# Patient Record
Sex: Female | Born: 1945 | Race: Black or African American | Hispanic: No | Marital: Married | Smoking: Never smoker
Health system: Southern US, Community
[De-identification: ages and names within clinical notes are randomized; demographics above are authoritative.]

## PROBLEM LIST (undated history)

## (undated) DIAGNOSIS — M1990 Unspecified osteoarthritis, unspecified site: Secondary | ICD-10-CM

## (undated) DIAGNOSIS — R12 Heartburn: Secondary | ICD-10-CM

## (undated) DIAGNOSIS — M810 Age-related osteoporosis without current pathological fracture: Secondary | ICD-10-CM

## (undated) DIAGNOSIS — K5730 Diverticulosis of large intestine without perforation or abscess without bleeding: Secondary | ICD-10-CM

## (undated) DIAGNOSIS — Z862 Personal history of diseases of the blood and blood-forming organs and certain disorders involving the immune mechanism: Secondary | ICD-10-CM

## (undated) DIAGNOSIS — I10 Essential (primary) hypertension: Secondary | ICD-10-CM

## (undated) DIAGNOSIS — E7800 Pure hypercholesterolemia, unspecified: Secondary | ICD-10-CM

## (undated) DIAGNOSIS — K219 Gastro-esophageal reflux disease without esophagitis: Secondary | ICD-10-CM

## (undated) DIAGNOSIS — K5900 Constipation, unspecified: Secondary | ICD-10-CM

## (undated) HISTORY — PX: STOMACH SURGERY: SHX791

## (undated) HISTORY — DX: Gastro-esophageal reflux disease without esophagitis: K21.9

## (undated) HISTORY — DX: Pure hypercholesterolemia, unspecified: E78.00

## (undated) HISTORY — PX: NASAL SINUS SURGERY: SHX719

## (undated) HISTORY — DX: Essential (primary) hypertension: I10

## (undated) HISTORY — PX: PARTIAL HYSTERECTOMY: SHX80

## (undated) HISTORY — DX: Heartburn: R12

## (undated) HISTORY — DX: Diverticulosis of large intestine without perforation or abscess without bleeding: K57.30

## (undated) HISTORY — DX: Age-related osteoporosis without current pathological fracture: M81.0

## (undated) HISTORY — DX: Personal history of diseases of the blood and blood-forming organs and certain disorders involving the immune mechanism: Z86.2

## (undated) HISTORY — DX: Constipation, unspecified: K59.00

## (undated) HISTORY — PX: FOOT SURGERY: SHX648

## (undated) HISTORY — DX: Unspecified osteoarthritis, unspecified site: M19.90

---

## 2001-02-09 ENCOUNTER — Ambulatory Visit (HOSPITAL_COMMUNITY): Admission: RE | Admit: 2001-02-09 | Discharge: 2001-02-09 | Payer: Self-pay | Admitting: Nephrology

## 2001-02-09 ENCOUNTER — Encounter: Payer: Self-pay | Admitting: Nephrology

## 2001-04-07 ENCOUNTER — Other Ambulatory Visit: Admission: RE | Admit: 2001-04-07 | Discharge: 2001-04-07 | Payer: Self-pay | Admitting: *Deleted

## 2002-02-27 ENCOUNTER — Encounter: Payer: Self-pay | Admitting: Nephrology

## 2002-02-27 ENCOUNTER — Ambulatory Visit (HOSPITAL_COMMUNITY): Admission: RE | Admit: 2002-02-27 | Discharge: 2002-02-27 | Payer: Self-pay | Admitting: Nephrology

## 2003-03-21 ENCOUNTER — Ambulatory Visit (HOSPITAL_COMMUNITY): Admission: RE | Admit: 2003-03-21 | Discharge: 2003-03-21 | Payer: Self-pay | Admitting: Nephrology

## 2003-03-21 ENCOUNTER — Encounter: Payer: Self-pay | Admitting: Nephrology

## 2003-11-17 ENCOUNTER — Emergency Department (HOSPITAL_COMMUNITY): Admission: EM | Admit: 2003-11-17 | Discharge: 2003-11-17 | Payer: Self-pay | Admitting: Emergency Medicine

## 2004-03-26 ENCOUNTER — Ambulatory Visit (HOSPITAL_COMMUNITY): Admission: RE | Admit: 2004-03-26 | Discharge: 2004-03-26 | Payer: Self-pay | Admitting: Nephrology

## 2004-05-26 ENCOUNTER — Emergency Department (HOSPITAL_COMMUNITY): Admission: EM | Admit: 2004-05-26 | Discharge: 2004-05-26 | Payer: Self-pay | Admitting: Emergency Medicine

## 2004-09-18 ENCOUNTER — Ambulatory Visit: Payer: Self-pay | Admitting: Orthopedic Surgery

## 2004-11-12 ENCOUNTER — Ambulatory Visit: Payer: Self-pay | Admitting: Orthopedic Surgery

## 2004-12-10 ENCOUNTER — Ambulatory Visit: Payer: Self-pay | Admitting: Orthopedic Surgery

## 2005-03-27 ENCOUNTER — Ambulatory Visit (HOSPITAL_COMMUNITY): Admission: RE | Admit: 2005-03-27 | Discharge: 2005-03-27 | Payer: Self-pay | Admitting: Nephrology

## 2005-08-27 ENCOUNTER — Ambulatory Visit: Payer: Self-pay | Admitting: Internal Medicine

## 2005-09-15 ENCOUNTER — Ambulatory Visit: Payer: Self-pay | Admitting: Internal Medicine

## 2005-09-15 ENCOUNTER — Ambulatory Visit (HOSPITAL_COMMUNITY): Admission: RE | Admit: 2005-09-15 | Discharge: 2005-09-15 | Payer: Self-pay | Admitting: Internal Medicine

## 2005-09-15 HISTORY — PX: ESOPHAGOGASTRODUODENOSCOPY: SHX1529

## 2005-09-15 HISTORY — PX: COLONOSCOPY: SHX174

## 2006-03-29 ENCOUNTER — Ambulatory Visit (HOSPITAL_COMMUNITY): Admission: RE | Admit: 2006-03-29 | Discharge: 2006-03-29 | Payer: Self-pay | Admitting: Internal Medicine

## 2006-04-07 ENCOUNTER — Ambulatory Visit: Payer: Self-pay | Admitting: Internal Medicine

## 2007-04-04 ENCOUNTER — Ambulatory Visit (HOSPITAL_COMMUNITY): Admission: RE | Admit: 2007-04-04 | Discharge: 2007-04-04 | Payer: Self-pay | Admitting: Internal Medicine

## 2007-04-28 ENCOUNTER — Ambulatory Visit: Payer: Self-pay | Admitting: Gastroenterology

## 2008-05-03 ENCOUNTER — Ambulatory Visit: Payer: Self-pay | Admitting: Gastroenterology

## 2008-08-08 ENCOUNTER — Ambulatory Visit (HOSPITAL_COMMUNITY): Admission: RE | Admit: 2008-08-08 | Discharge: 2008-08-08 | Payer: Self-pay | Admitting: Internal Medicine

## 2009-05-22 DIAGNOSIS — Z8639 Personal history of other endocrine, nutritional and metabolic disease: Secondary | ICD-10-CM

## 2009-05-22 DIAGNOSIS — Z8739 Personal history of other diseases of the musculoskeletal system and connective tissue: Secondary | ICD-10-CM

## 2009-05-22 DIAGNOSIS — E785 Hyperlipidemia, unspecified: Secondary | ICD-10-CM

## 2009-05-22 DIAGNOSIS — I10 Essential (primary) hypertension: Secondary | ICD-10-CM

## 2009-05-22 DIAGNOSIS — K219 Gastro-esophageal reflux disease without esophagitis: Secondary | ICD-10-CM | POA: Insufficient documentation

## 2009-05-22 DIAGNOSIS — K5730 Diverticulosis of large intestine without perforation or abscess without bleeding: Secondary | ICD-10-CM | POA: Insufficient documentation

## 2009-05-22 DIAGNOSIS — Z862 Personal history of diseases of the blood and blood-forming organs and certain disorders involving the immune mechanism: Secondary | ICD-10-CM

## 2009-05-23 ENCOUNTER — Ambulatory Visit: Payer: Self-pay | Admitting: Gastroenterology

## 2009-07-22 ENCOUNTER — Ambulatory Visit: Payer: Self-pay | Admitting: Orthopedic Surgery

## 2009-07-22 DIAGNOSIS — M7580 Other shoulder lesions, unspecified shoulder: Secondary | ICD-10-CM

## 2009-08-14 ENCOUNTER — Ambulatory Visit (HOSPITAL_COMMUNITY): Admission: RE | Admit: 2009-08-14 | Discharge: 2009-08-14 | Payer: Self-pay | Admitting: Internal Medicine

## 2010-01-09 ENCOUNTER — Ambulatory Visit: Payer: Self-pay | Admitting: Orthopedic Surgery

## 2010-01-13 ENCOUNTER — Encounter: Payer: Self-pay | Admitting: Orthopedic Surgery

## 2010-01-13 ENCOUNTER — Encounter (HOSPITAL_COMMUNITY): Admission: RE | Admit: 2010-01-13 | Discharge: 2010-02-12 | Payer: Self-pay | Admitting: Orthopedic Surgery

## 2010-08-18 ENCOUNTER — Ambulatory Visit (HOSPITAL_COMMUNITY): Admission: RE | Admit: 2010-08-18 | Discharge: 2010-08-18 | Payer: Self-pay | Admitting: Internal Medicine

## 2010-10-28 NOTE — Assessment & Plan Note (Signed)
Summary: BI SHOULDER PAIN GOES DOWN ARMS/[facility]/BS[facility]   Visit Type:  Follow-up Primary Provider:  Ouida Sills, M.D.  CC:  bilateral shoulder pain.  History of Present Illness: I saw Whitney Patterson in the office today for a followup visit.  Whitney Patterson is a 65 years old woman with the complaint of:  bilateral shoulder pain.  Meds: Gaviscon, Amlodipine, Lisinopril/HCTZ, Lovastatin, Aleve.  07/22/09 was seen for same problem, Impingement Syndrome received bilateral shoulder shots and was started on Relafen 500 two times a day.  Injections helped shoulders last year.  Says Whitney Patterson has CTS   Pain radiates to her arms and hands   Cant sleep on shoulders at night        Allergies: 1)  ! Dilantin 2)  ! Sulfa 3)  ! Codeine 4)  ! Penicillin  Past History:  Past Medical History: Last updated: 07/22/2009 Hypertension Hyperlipidemia Diverticulosis, sigmoid acid reflux  Review of Systems       Whitney Patterson denies any neck pain.  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  Normal strength in both supraspinatus tendons.  Both shoulders were stable.  Whitney Patterson did have bilateral positive impingement signs.      Impression & Recommendations:  Problem # 1:  IMPINGEMENT SYNDROME (ICD-726.2) Assessment Deteriorated injectable shoulders  RIGHT Verbal consent obtained/The shoulder was injected with depomedrol 40mg /cc and sensorcaine .25% . There were no  complications  LEFT Verbal consent obtained/The shoulder was injected with depomedrol 40mg /cc and sensorcaine .25% . There were no complications  Does not seem to have cervical spine disease.  Does not seem to have torn cuff.  Recommend injection and physical therapy.   Orders: Physical Therapy Referral (PT) Est. Patient Level IV (16109) Depo- Medrol 40mg  (J1030) Joint Aspirate / Injection, Large (20610)  Patient Instructions: 1)  You have received an injection of cortisone today. You may experience increased pain at the injection site. Apply ice pack to the area for 20 minutes every 2 hours and take 2 xtra strength tylenol every 8 hours. This increased pain will usually resolve in 24 hours. The injection will take effect in 3-10 days.  2)  PT 3)  f/u as needed

## 2010-10-28 NOTE — Miscellaneous (Signed)
Summary: OT clinical evaluation  OT clinical evaluation   Imported By: Jacklynn Ganong 02/21/2010 09:17:42  _____________________________________________________________________  External Attachment:    Type:   Image     Comment:   External Document

## 2010-11-27 ENCOUNTER — Other Ambulatory Visit (HOSPITAL_COMMUNITY): Payer: Self-pay | Admitting: Internal Medicine

## 2010-11-27 ENCOUNTER — Ambulatory Visit (HOSPITAL_COMMUNITY)
Admission: RE | Admit: 2010-11-27 | Discharge: 2010-11-27 | Disposition: A | Discharge status: Home / Self Care | Payer: 59 | Source: Ambulatory Visit | Attending: Internal Medicine | Admitting: Internal Medicine

## 2010-11-27 DIAGNOSIS — R0602 Shortness of breath: Secondary | ICD-10-CM | POA: Insufficient documentation

## 2010-11-27 DIAGNOSIS — R0789 Other chest pain: Secondary | ICD-10-CM | POA: Insufficient documentation

## 2011-02-10 NOTE — Assessment & Plan Note (Signed)
NAMEGREGG, HOLSTER              CHART#:  [id number]   DATE:  05/03/2008                       DOB:  Jul 04, 1946   REFERRING PHYSICIAN:  Kingsley Callander. Ouida Sills, MD   PROBLEM LIST:  1. Gastroesophageal reflux disease.  2. Sigmoid diverticulosis.  3. History of anemia.  4. Hypertension.  5. Hyperlipidemia.  6. Family history of colon cancer (father greater than age 63).   SUBJECTIVE:  The patient is a 65 year old female who presents as a  return patient visit.  She has no questions, concerns, or complaints.  She keeps sinus problems.  She is on Zegerid daily and it controls her  symptoms 100% of the time.  If she eats something that she is not  supposed to, then she does have problems with heartburn.  She only has  problem swallowing her pills.  She is taking B12 to build up the  vitamins in her body.   MEDICATIONS:  1. Lisinopril/hydrochlorothiazide.  2. Lovastatin.  3. Amlodipine.  4. Zegerid 40 mg daily.  5. Aspirin 325 mg daily.  6. Vitamin B12 500 mcg daily.   OBJECTIVE:  VITAL SIGNS:  Weight 176 pounds (unchanged since July 2008),  height 5 feet and 1 inch, temperature 99.3, blood pressure 130/88, and  pulse 64.  GENERAL:  She is in no apparent distress.  Alert and oriented x4.LUNGS:  Clear to auscultation bilaterally.CARDIOVASCULAR:  Regular  rhythm.ABDOMEN:  Bowel sounds are present, soft, nontender, and  nondistended.   ASSESSMENT:  The patient is a 65 year old female who has  gastroesophageal reflux disease, which is well controlled on Zegerid.  Thank you for allowing me to see the patient in consultation.  My  recommendations follow.   RECOMMENDATIONS:  1. She should continue her Zegerid.  2. Screening colonoscopy in 5-10 years because her first-degree      relative was over the age of      40 when he was diagnosed with colon cancer.  3. Return patient visit in 1 year.       Kassie Mends, M.D.  Electronically Signed     SM/MEDQ  D:  05/03/2008  T:   05/03/2008  Job:  [id number]   cc:   Kingsley Callander. Ouida Sills, MD

## 2011-02-10 NOTE — Assessment & Plan Note (Signed)
Whitney Patterson, Whitney Patterson              CHART#:  [id number]   DATE:  04/28/2007                       DOB:  22-Dec-1945   REFERRING PHYSICIAN:  Carylon Perches.   PROBLEM LIST:  1. Gastroesophageal reflux disease.  2. Sigmoid diverticulosis.  3. History of anemia.  4. Hypertension.  5. Hyperlipidemia.  6. Family history of colon cancer( father > 60)   SUBJECTIVE:  The patient is a 65 year old female who presents as a  return patient visit.  She is a new patient to me.  She is here for her  yearly check up for her gastroesophageal reflux disease.  She denies any  difficulty swallowing.  Her heart burn is controlled with her omeprazole  twice a day.  She is taking it before she eats.  Her father had colon  cancer at age greater then 52.  She has no personal history of polyps.  She continues to take iron daily.  She has no need for Barrett's  surveillance because her initial screening upper endoscopy in December  of 2006 showed no evidence of Barrett esophagus.   MEDICATIONS:  1. Enalapril/HCTZ 5/12.5 mg daily.  2. Omeprazole 20 mg b.i.d.  3. Lovastatin.20 mg daily.  4. Felodipine 10 mg daily  5. Advil as needed.  6. Iron daily.   OBJECTIVE:  VITAL SIGNS:  Weight 176 pounds, height 5 feet 1 inch,  temperature 99.3, blood pressure 120/72, pulse 60.  GENERAL:  She is in no apparent distress.  Alert and oriented times 4.  HEENT EXAM:  Atraumatic, normocephalic.  Pupils equal and reactive to  light.  Mouth no oral lesions.  Posterior pharynx without erythema or  exudate.  NECK:  Full range of motion.  No lymphadenopathy.  LUNGS:  Clear to auscultation bilaterally.  CARDIOVASCULAR EXAM:  Irregular rhythm.  No murmur.  Normal S1 and S2.  ABDOMEN:  Bowel sounds are present.  Soft, nontender, nondistended.  No  rebound or guarding.  EXTREMITIES:  Without cyanosis, clubbing or edema.  NEUROLOGICAL:  She has no focal neurologic deficits.   ASSESSMENT:  The patient is a 65 year old female whose  gastroesophageal  reflux disease is well controlled on omeprazole twice daily.  She has a  family history of colon cancer in a first degree relative over the age  of 55.  Thank you for allowing me to see the patient in consultation.  My recommendations follow.   RECOMMENDATIONS:  1. Patient should continue her omeprazole twice daily.  2. Screening colonoscopy in 5-10 years, because her first degree      relative was over the age of 6 when diagnosed with colon cancer.  3. Return patient visit in 1 year.       Kassie Mends, M.D.  Electronically Signed     SM/MEDQ  D:  04/28/2007  T:  04/29/2007  Job:  [id number]   cc:   Kingsley Callander. Ouida Sills, MD

## 2011-02-13 NOTE — Op Note (Signed)
NAMEKATHIE, POSA             ACCOUNT NO.:  [id number]   MEDICAL RECORD NO.:  [id number]          PATIENT TYPE:  AMB   LOCATION:  DAY                           FACILITY:  [facility]   PHYSICIAN:  Lionel December, M.D.    DATE OF BIRTH:  Jun 04, 1946   DATE OF PROCEDURE:  09/15/2005  DATE OF DISCHARGE:                                 OPERATIVE REPORT   PROCEDURE:  Esophagogastroduodenoscopy with esophageal dilation followed by  colonoscopy.   INDICATION:  Whitney Patterson is a 65 year old African-American female with chronic  GERD who is also experiencing dysphagia. She was seen in the office recently  and begun on Zegerid, and she feels that it has work better than other  medicines that she has taken in the past. She is also undergoing high-risk  screening colonoscopy. Father had colon carcinoma. Her last colonoscopy was  in '99. Procedure and risks were reviewed with the patient, and informed  consent was obtained.   MEDICINES FOR CONSCIOUS SEDATION:  Cetacaine spray for pharyngeal topical  anesthesia, Demerol 50 mg IV, Versed 5 mg IV.   FINDINGS:  Procedures performed in endoscopy suite. The patient's vital  signs and O2 saturation were monitored during the procedure and remained  stable.   PROCEDURE #1:  Esophagogastroduodenoscopy. The patient was placed in left  lateral position, and Olympus videoscope was passed via oropharynx without  any difficulty into esophagus.   Esophagus. Mucosa of the esophagus was normal throughout. GE junction was  located at 36 cm from the incisors and was unremarkable. No hernia was  noted.   Stomach. It was empty and distended very well with insufflation. Folds of  proximal stomach were normal. Examination mucosa of mucosa revealed patchy  erythema and granularity at antrum, but no erosions or ulcers were noted.  Pyloric channel was patent. Angularis, fundus and cardia were examined by  retroflexing the scope and were normal.   Endoscope was withdrawn,  and esophagus was dilated by passing 54-French  Maloney dilator to full insertion. As the dilator was withdrawn, endoscope  was passed again, and there was no mucosal disruption noted to esophagus.  Endoscope was withdrawn. The patient prepared for procedure #2.   PROCEDURE #2:  Colonoscopy. Rectal examination performed. No abnormality  noted on external or digital exam. Olympus videoscope was placed in rectum  and advanced under vision into sigmoid colon and beyond. Preparation was  excellent. Scope was passed into cecum which was identified by appendiceal  orifice and ileocecal valve. Pictures taken for the record. As the scope was  withdrawn, colonic mucosa was carefully examined. There were a few tiny  diverticula at sigmoid colon, but no polyps and/or tumor masses noted.  Rectal mucosa similarly was normal. Scope was retroflexed to examine  anorectal junction which was unremarkable. Endoscope was straightened and  withdrawn. The patient tolerated the procedures well.   FINAL DIAGNOSIS:  1.  Normal examination of esophagus. Esophagus dilated by passing 54-French      Maloney dilator given history of dysphagia but without mucosal      disruption.  2.  Nonerosive antral gastritis.  3.  Few  tiny diverticula at sigmoid colon, otherwise normal colonoscopy.   RECOMMENDATIONS:  She will continue entire anti-measures and Zegerid at 40  mg p.o. q.a.m., prescription given for 30 doses with 5 refills.   She should continue yearly Hemoccults and consider next screening exam in  five years. Regarding her GERD, I would suggest that she follow-up with [place]  in six months.      Lionel December, M.D.  Electronically Signed     NR/MEDQ  D:  09/15/2005  T:  09/16/2005  Job:  [id number]
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
## 2011-05-05 ENCOUNTER — Other Ambulatory Visit: Payer: Self-pay

## 2011-05-05 ENCOUNTER — Encounter: Payer: Self-pay | Admitting: Orthopedic Surgery

## 2011-05-05 ENCOUNTER — Ambulatory Visit (INDEPENDENT_AMBULATORY_CARE_PROVIDER_SITE_OTHER): Payer: 59 | Admitting: Orthopedic Surgery

## 2011-05-05 VITALS — Resp 16 | Ht 61.5 in | Wt 166.0 lb

## 2011-05-05 DIAGNOSIS — M549 Dorsalgia, unspecified: Secondary | ICD-10-CM

## 2011-05-05 DIAGNOSIS — M5137 Other intervertebral disc degeneration, lumbosacral region: Secondary | ICD-10-CM

## 2011-05-05 DIAGNOSIS — M5136 Other intervertebral disc degeneration, lumbar region: Secondary | ICD-10-CM

## 2011-05-05 MED ORDER — PREDNISONE (PAK) 10 MG PO TABS: 10.0000 mg | ORAL_TABLET | Freq: Every day | ORAL | Status: AC

## 2011-05-05 NOTE — Progress Notes (Signed)
New problem  Chief complaint: Pain LEFT hip HPI:(4) This is a 65 year old female, who has severe throbbing pain in her lumbar spine and LEFT side of her back which is worse with sitting, associated with some catching. She denies any injury. She has not been treated. Her pain started several months ago. She denies any radicular symptoms. She denies any urinary symptoms.  ROS:(2) Positive findings include blurred vision, watering of the chest pain, shortness of breath, heartburn.  PFSH: (1) Medical history positive for high blood pressure heartburn.  Physical Exam(12) GENERAL: normal development   CDV: pulses are normal   Skin: normal  Lymph: nodes were not palpable/normal  Psychiatric: awake, alert and oriented  Neuro: normal sensation  MSK She is tender in the lumbar spine. Her lower extremities are well aligned without contracture subluxation, atrophy or tremor skin of both legs is intact. Reflexes are normal.  Assessment: Degenerative disc disease, and back pain, lumbar spine. X-rays show degenerative disc disease, normal coronal and sagittal plane alignment    Plan: Recommend steroid Dosepak and physical therapy.  Separate x-ray report 3 views, lumbar spine. There is degenerative disc disease throughout the lumbar spine although the disc spaces have maintained their height. There is facet arthritis.  Impression degenerative disc disease. L-Spine

## 2011-05-05 NOTE — Patient Instructions (Signed)
Start new medicine  Go for physical therapy  Come back in 6 weeks

## 2011-05-12 ENCOUNTER — Ambulatory Visit (HOSPITAL_COMMUNITY)
Admission: RE | Admit: 2011-05-12 | Discharge: 2011-05-12 | Disposition: A | Discharge status: Home / Self Care | Payer: 59 | Source: Ambulatory Visit | Attending: Orthopedic Surgery | Admitting: Orthopedic Surgery

## 2011-05-12 DIAGNOSIS — IMO0001 Reserved for inherently not codable concepts without codable children: Secondary | ICD-10-CM | POA: Insufficient documentation

## 2011-05-12 DIAGNOSIS — M545 Low back pain, unspecified: Secondary | ICD-10-CM | POA: Insufficient documentation

## 2011-05-12 DIAGNOSIS — R262 Difficulty in walking, not elsewhere classified: Secondary | ICD-10-CM | POA: Insufficient documentation

## 2011-05-12 DIAGNOSIS — I10 Essential (primary) hypertension: Secondary | ICD-10-CM | POA: Insufficient documentation

## 2011-05-12 DIAGNOSIS — M6281 Muscle weakness (generalized): Secondary | ICD-10-CM | POA: Insufficient documentation

## 2011-05-12 NOTE — Progress Notes (Signed)
Patient Name: Whitney Patterson MRN: [id number] Today's Date: 05/12/2011 Time: 8657-8469 Visit: 1 of 8 Re-eval Due: 06/09/11 Charges: 1 eval, 10 min TE, 5 min man Treatment: SUPINE:  SKTC LLE 3x30 sec SEATED:  Piriformis Stretch 3x30 sec. STANDING:  LLE Hip flexor stretch 3x30 sec. 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  Pt reports that she has more pain on her left side then the right side.  She reports that her pain is the worst when she sits for awhile and then goes to stand as well as when she moves incorrectly.  C/co is pain with increased dynamic movement while standing. MEDICAL HISTORY: HTN, GERD, Hyperlipidema, Gout, animea, shoulder pain PREVIOUS THERAPY: Pt has not received therapy for this condition in the past.  Cognitive Status: Pt is alert and oriented x's 4.                             Social History: Pt is married and enjoys cleaning and cooking.   Current Functional Status: Sit: with appropriate posture or R lateral lean without an increase in pain. Walk: eases with walking, Stand: unlimited with cautious movements.  Sleep: on the right side or on stomach.  Pt reports that she has most of her pain while going into certain position causes increased pain.  Reports that she has the most difficulty with pulling trash out of the trashcans  ( large), pain with washing the commodes. Prior Functional Status: Able to move in any direction without an increase in pain.  Barriers to Education: N/A. EMPLOYMENT DATA: Currently working in  house cleaning.   SUBJECTIVE:  PATIENT GOAL:  PAIN RATING: (on a scale from 0-10):  worst:  10/10    best:  2/10    current:  8/10   Nature: LB general pain to mid lumbar spine w/o radiculopathy  Aggravating Factors: fast movements or leaning to the right  Alleviating Factors: walking OBJECTIVE: POSTURE: Sitting with lumbar flexion to her Right. OBSERVATION: Difficulty maintaining a comfortable position.  AROM:  Hip and Lumbar WNL STRENGTH:   Lower Extremity Right Left  Hip flexion 5/5 4/5  Glute Max 4/5 3+/5  Glute Med 4/5 3+/5  Hip adductors 4/5 2+/5  Quadriceps 5/5 4/5  Hamstrings 5/5 4/5  Lumbar 4/5   Abdominals 3/5    NEUROLOGIC: Pt able to heel and toe walk.  Sensation is normal to BLE.   GAIT: Mild antalgic gait with decreased stance on her LLE.  FUNCTION: See above.  Balance: WNL, impaired body mechanics with lifting objects from the floor and with bed mobility.  PALPATION: Pin point Pain and tenderness to her L SI joint without significant spasm to gluteal region. Significant spasm to B: iliopsoas.   Special Test: \+ B SLR, + Crossed SLR ,  - ASLR, - SI Compression,  -B 90/90 HS,  + B Thomas test with Rectus and ITB sign, - B Ober's, + B FABER  CLINICAL ASSESSMENT:  Pt is a  65 y.o. female referred to PT for LBP.  After examination it was found that the patient has current body structure impairments including: increased pain with radicular pain to L leg, significant iliopsoas spasms, decreased LE and core strength, decreased flexibility, lack of knowledge on body mechanics and impaired posture which are limiting her ability to participate in community and work activities and functions. Pt will benefit from skilled PT service to address the above body structure impairments in order to maximize function in order to improve quality of life.      TREATMENT DIAGNOSIS: Low Back Pain 724.2 Rehab Potential: Good PLAN: 1. Therapeutic exercise to include stretching, strengthening and  HEP. 2. Gait training 3. Balance re-education 4. Manual techniques and modalities as needed for pain reduction.  FREQUENCY / DURATION: 2 x/week x 4 weeks.  SHORT TERM GOALS: to be met in 2 weeks. Patient will be able to: 1. Pt will be Independent in HEP in order to maximize therapeutic effect. 2. Pt will report pain less than 4/10 for 50% of her day. 3. Pt will have minimal spasm to anterior hip region.  4. Pt will increase LE strength by 1 muscle grade for improved ambulation. LONG TERM GOALS: to be met in 4 weeks. Patient will be able to: 1. Pt will report pain less than or equal to 3/10 for 75% of her day for improved quality of life. 2. Pt will improve LE flexibility in order to ambulate with improved gait mechanics and proper posture.  3. Pt will present with proper body mechanics in order to take trash out of a large trashcan without an increase in back pain.   INITIAL TREATMENT: Initial evaluation, HEP and education on role of PT.  Pt agreeable to tx. And plan PRECAUTIONS: N/A. CONTRAINDICATIONS: N/A. Thank you for the referral,   ____________________________                                                                              Annett Fabian, PT              Date                      Physician Treatment Plan Checklist Your signature is required to indicate approval of the treatment plan as stated above.  Please make a copy of this report for your files and return this physician signed original in the self-addressed envelope. PLEASE CHECK ONE: _____ 1. APPROVE PLAN _____2. APPROVE PLAN WITH THE FOLLOWING CHANGES: ______ __________________________________________________________________________________ ________________________________   __________________________ PHYSICIAN SIGNATURE       DATE    Brenan Modesto 05/12/2011, 4:18 PM

## 2011-05-14 ENCOUNTER — Ambulatory Visit (HOSPITAL_COMMUNITY)
Admission: RE | Admit: 2011-05-14 | Discharge: 2011-05-14 | Disposition: A | Discharge status: Home / Self Care | Payer: 59 | Source: Ambulatory Visit | Attending: Physical Therapy | Admitting: Physical Therapy

## 2011-05-14 NOTE — Progress Notes (Signed)
Physical Therapy Treatment Patient Details  Name: Whitney Patterson MRN: [id number] Date of Birth: 10-21-1945  Today's Date: 05/14/2011 Time: 1308-6578 Time Calculation (min): 45 min Visit#: 2 of 8 Re-eval: 06/09/11 Charge: therex 30 min Manual 7 min  Subjective: Symptoms/Limitations Symptoms: 3/10 LBP/L hip Pain Assessment Currently in Pain?: Yes Pain Score:   3 Pain Location: Back Pain Orientation: Left;Lower  Objective:  Exercise/Treatments SUPINE:  SKTC LLE 3x30 sec  LTR 3x 20" Bridge 10x Abd iso 10x 5" with diaphragmatic breathing Clam 10x Manual Piriformis st (knee to opp. Shoulder) 3x 30" SIDELYING: Abduction 10x B Adduction 10x B PRONE: Hip Extension 10x B STANDING:  LLE Hip flexor stretch 3x30 sec.  Functional squats 10x with manual assistance for proper position/tech MANUAL:  SCS Bil iliopsoas w/ 100% release x 7 min TM @ 1.5 x 5'  Physical Therapy Assessment and Plan PT Assessment and Plan Clinical Impression Statement: Pt tolerated well towards all ex with decreased pain at end of session following manual SCS to iliopsoas.  VC for posture and equal stride length with TM, pt able to amb properly following cues. PT Plan: Continue with current POC, add heel/toe raises and progress core/lumbar strength.    Goals    Problem List Patient Active Problem List  Diagnoses  . HYPERLIPIDEMIA  . HYPERTENSION  . GERD  . DIVERTICULOSIS, SIGMOID COLON  . IMPINGEMENT SYNDROME  . GOUT, HX OF  . ANEMIA, HX OF  . ARTHRITIS, HX OF  . Low back pain    PT - End of Session Activity Tolerance: Patient tolerated treatment well General Behavior During Session: [facility] for tasks performed Cognition: [facility] for tasks performed  [name] 05/14/2011, 2:45 PM

## 2011-05-19 ENCOUNTER — Ambulatory Visit (HOSPITAL_COMMUNITY)
Admission: RE | Admit: 2011-05-19 | Discharge: 2011-05-19 | Disposition: A | Discharge status: Home / Self Care | Payer: 59 | Source: Ambulatory Visit | Attending: Internal Medicine | Admitting: Internal Medicine

## 2011-05-19 NOTE — Progress Notes (Signed)
Physical Therapy Treatment Patient Details  Name: Whitney Patterson MRN: [id number] Date of Birth: 15-Oct-1945  Today's Date: 05/19/2011 Time: 1304-1400 Time Calculation (min): 56 min Visit#: 3 of 8 Re-eval: 06/09/11 Charge: therex x 50 min  Subjective: Symptoms/Limitations Symptoms: Pain free today Pain Assessment Currently in Pain?: No/denies  Objective:    Exercise/Treatments Stretches Single Knee to Chest Stretch: 3 reps;30 seconds Lower Trunk Rotation: 3 reps;20 seconds Piriformis Stretch: 3 reps;30 seconds Lumbar Exercises   Stability Clam: 10 reps;Supine Bridge: 10 reps Bent Knee Raise: 10 reps Ab Set: 10 reps;5 seconds Large Ball Abdominal Isometric: Supine;5 reps;5 seconds Large Ball Oblique Isometric: Supine;5 reps;5 seconds Hip Abduction: 10 reps;Side-lying Functional Squats: 10 reps Heel Raises: 10 reps Hip Adduction B Sidelying 10 reps Machine Exercises Cybex Lumbar Extension: 10 reps 2 PL Tread Mill: 5' @ 1.7     Physical Therapy Assessment and Plan PT Assessment and Plan Clinical Impression Statement: Added abdominal strengthening ex with ball with tactile/vc required for proper technique. PT Plan: Continue with current POC, progress core/lumbar strength, add prone lumbar strengthening ex next tx.    Goals    Problem List Patient Active Problem List  Diagnoses  . HYPERLIPIDEMIA  . HYPERTENSION  . GERD  . DIVERTICULOSIS, SIGMOID COLON  . IMPINGEMENT SYNDROME  . GOUT, HX OF  . ANEMIA, HX OF  . ARTHRITIS, HX OF  . Low back pain    PT - End of Session Activity Tolerance: Patient tolerated treatment well General Behavior During Session: [facility] for tasks performed Cognition: [facility] for tasks performed  [name] 05/19/2011, 1:59 PM

## 2011-05-21 ENCOUNTER — Ambulatory Visit (HOSPITAL_COMMUNITY)
Admission: RE | Admit: 2011-05-21 | Discharge: 2011-05-21 | Disposition: A | Discharge status: Home / Self Care | Payer: 59 | Source: Ambulatory Visit

## 2011-05-21 NOTE — Progress Notes (Signed)
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 Pain Assessment Currently in Pain?: No/denies  Objective:   Exercise/Treatments Stretches Single Knee to Chest Stretch: Other (comment) (HEP) Lower Trunk Rotation: 3 reps;20 seconds;Other (comment) (HEP) Piriformis Stretch: 3 reps;30 seconds Lumbar Exercises   Stability Clam: 15 reps Bridge: 15 reps Bent Knee Raise: 10 reps Ab Set: 10 reps;5 seconds Large Ball Abdominal Isometric: 10 reps;5 seconds Large Ball Oblique Isometric: 10 reps;5 seconds Hip Abduction: Side-lying;15 reps Heel Squeeze: 5 reps;5 seconds Single Arm Raise: 5 reps;5 seconds Leg Raise: 10 reps;5 seconds Opposite Arm/Leg Raise: Right arm/Left leg;Left arm/Right leg;5 reps;5 seconds;Prone Functional Squats: 15 reps Wall Slides: 5 reps;5 seconds Machine Exercises Cybex Lumbar Extension: 10 reps 3 PL Tread Mill: 5' @ 1.9   Standing: Tread Mill: 5' @ 1.9 Functional Squats: 10 reps  Wall slides 5 x 5" Cybex Lumbar Extension: 10 reps 2 PL  Supine Lower Trunk Rotation: 3 reps;20 seconds  Piriformis Stretch: 3 reps;30 seconds  Clam: 15 reps;Supine  Bridge: 15 reps  Bent Knee Raise: 10 reps  Ab Set: 10 reps;5 seconds  Large Ball Abdominal Isometric: Supine;5 reps;5 seconds  Large Ball Oblique Isometric: Supine;5 reps;5 seconds  Prone: Heel squeeze 5x 5" SAR 5x 5" SLR 10x 5" Opposite arm/leg 5x 5" Sidelying: Abduction: 15 reps B Adduction: 15 reps B  Physical Therapy Assessment and Plan PT Assessment and Plan Clinical Impression Statement: Progressed to prone lumbar therex with verbal/tactile cues for proper  form/tech, pt performed correctly following cues.  LTR and SKTC moved to HEP, pt able to perform correctly with no cueing required. PT Plan: Continue with current POC, progress core/lumbar strength add cybex hip ex machine next tx.    Goals    Problem List Patient Active Problem List  Diagnoses  . HYPERLIPIDEMIA  . HYPERTENSION  . GERD  . DIVERTICULOSIS, SIGMOID COLON  . IMPINGEMENT SYNDROME  . GOUT, HX OF  . ANEMIA, HX OF  . ARTHRITIS, HX OF  . Low back pain    PT - End of Session Activity Tolerance: Patient tolerated treatment well General Behavior During Session: [facility] for tasks performed Cognition: [facility] for tasks performed  Whitney Patterson 05/21/2011, 2:03 PM

## 2011-05-26 ENCOUNTER — Inpatient Hospital Stay (HOSPITAL_COMMUNITY): Admission: RE | Admit: 2011-05-26 | Payer: 59 | Source: Ambulatory Visit

## 2011-05-28 ENCOUNTER — Ambulatory Visit (HOSPITAL_COMMUNITY): Payer: 59 | Admitting: Physical Therapy

## 2011-06-02 ENCOUNTER — Ambulatory Visit (HOSPITAL_COMMUNITY): Payer: 59

## 2011-06-04 ENCOUNTER — Ambulatory Visit (HOSPITAL_COMMUNITY): Payer: 59 | Admitting: Physical Therapy

## 2011-06-11 ENCOUNTER — Telehealth: Payer: Self-pay | Admitting: Orthopedic Surgery

## 2011-06-11 NOTE — Telephone Encounter (Signed)
Patient cancelled follow up appointment for now for 06/17/11; offered re-schedule, states will call back to re-schedule.

## 2011-06-17 ENCOUNTER — Ambulatory Visit: Payer: 59 | Admitting: Orthopedic Surgery

## 2011-07-13 ENCOUNTER — Other Ambulatory Visit (HOSPITAL_COMMUNITY): Payer: Self-pay | Admitting: Internal Medicine

## 2011-07-13 DIAGNOSIS — Z139 Encounter for screening, unspecified: Secondary | ICD-10-CM

## 2011-08-24 ENCOUNTER — Ambulatory Visit (HOSPITAL_COMMUNITY)
Admission: RE | Admit: 2011-08-24 | Discharge: 2011-08-24 | Disposition: A | Discharge status: Home / Self Care | Payer: 59 | Source: Ambulatory Visit | Attending: Internal Medicine | Admitting: Internal Medicine

## 2011-08-24 DIAGNOSIS — Z139 Encounter for screening, unspecified: Secondary | ICD-10-CM

## 2011-08-24 DIAGNOSIS — Z1231 Encounter for screening mammogram for malignant neoplasm of breast: Secondary | ICD-10-CM | POA: Insufficient documentation

## 2012-03-17 ENCOUNTER — Encounter: Payer: Self-pay | Admitting: Gastroenterology

## 2012-04-13 ENCOUNTER — Encounter: Payer: Self-pay | Admitting: Internal Medicine

## 2012-04-15 ENCOUNTER — Encounter: Payer: Self-pay | Admitting: Urgent Care

## 2012-04-15 ENCOUNTER — Ambulatory Visit (INDEPENDENT_AMBULATORY_CARE_PROVIDER_SITE_OTHER): Payer: 59 | Admitting: Urgent Care

## 2012-04-15 ENCOUNTER — Encounter (HOSPITAL_COMMUNITY): Payer: Self-pay | Admitting: Pharmacy Technician

## 2012-04-15 ENCOUNTER — Other Ambulatory Visit: Payer: Self-pay | Admitting: Internal Medicine

## 2012-04-15 ENCOUNTER — Other Ambulatory Visit: Payer: Self-pay | Admitting: Gastroenterology

## 2012-04-15 VITALS — BP 132/77 | HR 66 | Temp 97.2°F | Ht 61.0 in | Wt 171.6 lb

## 2012-04-15 DIAGNOSIS — K5900 Constipation, unspecified: Secondary | ICD-10-CM | POA: Insufficient documentation

## 2012-04-15 DIAGNOSIS — Z800 Family history of malignant neoplasm of digestive organs: Secondary | ICD-10-CM

## 2012-04-15 DIAGNOSIS — K5909 Other constipation: Secondary | ICD-10-CM

## 2012-04-15 MED ORDER — PEG 3350-KCL-NA BICARB-NACL 420 G PO SOLR: ORAL | Status: AC

## 2012-04-15 NOTE — Assessment & Plan Note (Signed)
Due for high-risk screening colonoscopy.  I have discussed risks & benefits which include, but are not limited to, bleeding, infection, perforation & drug reaction.  The patient agrees with this plan & written consent will be obtained.    

## 2012-04-15 NOTE — Progress Notes (Signed)
Primary Care Physician:  Avon Gully, MD Primary Gastroenterologist:  Dr. Jonette Eva  Chief Complaint  Patient presents with  . Colonoscopy    HPI:  Whitney Patterson is a 66 y.o. female here to set up high risk screening colonoscopy due to family history of colon cancer in her father.  She reports lifelong chronic constipation for which she takes Epson salts or [employer] MOM andely.  Hx  GERD well controlled on Prilosec 20mg  daily.   Denies any nausea, vomiting, dysphagia, odynophagia or anorexia.   Denies rectal bleeding, melena or weight loss.  Past Medical History  Diagnosis Date  . HTN (hypertension)   . Arthritis   . High cholesterol   . Heart burn   . Gastroesophageal reflux disease   . Sigmoid diverticulosis   . History of anemia   . Constipation     Past Surgical History  Procedure Date  . Foot surgery   . Stomach surgery   . Partial hysterectomy   . Esophagogastroduodenoscopy 09/15/2005    Normal examination of esophagus. Esophagus dilated by passing 54-French  Maloney dilator given history of dysphagia but without mucosal disruption/ Nonerosive antral gastritis  . Colonoscopy 09/15/2005    Few tiny diverticula at sigmoid colon, otherwise normal colonoscopy.  . Nasal sinus surgery      A BULLET REMOVED FROM HER RIGHT FRONTAL SINUS 20 + YEARS AGO     Current Outpatient Prescriptions  Medication Sig Dispense Refill  . amLODipine (NORVASC) 10 MG tablet Take 10 mg by mouth daily.        [name] Kitchen lisinopril-hydrochlorothiazide (PRINZIDE,ZESTORETIC) 10-12.5 MG per tablet Take 1 tablet by mouth daily.        [name] Kitchen lovastatin (MEVACOR) 20 MG tablet Take 20 mg by mouth at bedtime.        . Naproxen Sodium (ALEVE) 220 MG CAPS Take by mouth.        [name] Kitchen omeprazole (PRILOSEC) 20 MG capsule Take 20 mg by mouth daily.        . polyethylene glycol-electrolytes (TRILYTE) 420 G solution Use as directed Also buy 1 fleet enema & 4 dulcolax tablets to use as directed  4000 mL  0     Allergies as of 04/15/2012 - Review Complete 04/15/2012  Allergen Reaction Noted  . Codeine    . Penicillins    . Phenytoin    . Sulfonamide derivatives      Family History  Problem Relation Age of Onset  . Heart disease    . Arthritis    . Cancer    . Diabetes    . Colon cancer Father 17    History   Social History  . Marital Status: Married    Spouse Name: N/A    Number of Children: 0  . Years of Education: 8th grade   Occupational History  . maid   .     Social History Main Topics  . Smoking status: Never Smoker   . Smokeless tobacco: Not on file  . Alcohol Use: No     heavy etoh x73yrs, quit 1991  . Drug Use: Not on file  . Sexually Active: Not on file  Review of Systems: Gen: Denies any fever, chills, sweats, anorexia, fatigue, weakness, malaise, weight loss, and sleep disorder CV: Denies chest pain, angina, palpitations, syncope, orthopnea, PND, peripheral edema, and claudication. Resp: Denies dyspnea at rest, dyspnea with exercise, cough, sputum, wheezing, coughing up blood, and pleurisy. GI: Denies vomiting blood, jaundice, and fecal incontinence.  GU : Denies urinary burning, blood in urine, urinary frequency, urinary hesitancy, nocturnal urination, and urinary incontinence. MS: Denies joint pain, limitation of movement, and swelling, stiffness, low back pain, extremity pain. Denies muscle weakness, cramps, atrophy.  Derm: Denies rash, itching, dry skin, hives, moles, warts, or unhealing ulcers.  Psych: Denies depression, anxiety, memory loss, suicidal ideation, hallucinations, paranoia, and confusion. Heme: Denies bruising, bleeding, and enlarged lymph nodes. Neuro:  Denies any headaches, dizziness, paresthesias. Endo:  Denies any problems with DM, thyroid, adrenal function.  Physical Exam: BP 132/77  Pulse 66  Temp 97.2 F (36.2 C) (Temporal)  Ht 5\' 1"  (1.549 m)  Wt 171 lb 9.6 oz (77.837 kg)  BMI 32.42 kg/m2 General:   Alert,   Well-developed, well-nourished, pleasant and cooperative in NAD Head:  Normocephalic and atraumatic. Eyes:  Sclera clear, no icterus.   Conjunctiva pink. Ears:  Normal auditory acuity. Nose:  No deformity, discharge, or lesions. Mouth:  No deformity or lesions,oropharynx pink & moist. Neck:  Supple; no masses or thyromegaly. Lungs:  Clear throughout to auscultation.   No wheezes, crackles, or rhonchi. No acute distress. Heart:  Regular rate and rhythm; no murmurs, clicks, rubs,  or gallops. Abdomen:  Normal bowel sounds.  No bruits.  Soft, non-tender and non-distended without masses, hepatosplenomegaly or hernias noted.  No guarding or rebound tenderness.   Rectal:  Deferred. Msk:  Symmetrical without gross deformities. Normal posture. Pulses:  Normal pulses noted. Extremities:  No clubbing or edema. Neurologic:  Alert and  oriented x4;  grossly normal neurologically. Skin:  Intact without significant lesions or rashes. Lymph Nodes:  No significant cervical adenopathy. Psych:  Alert and cooperative. Normal mood and affect.

## 2012-04-15 NOTE — Assessment & Plan Note (Signed)
Lifelong Constipation High-fiber diet MiraLax 17 g daily as needed for constipation (over-the-counter)

## 2012-04-15 NOTE — Patient Instructions (Addendum)
Colonoscopy with Dr. Lambert Keto diet MiraLax 17 g daily as needed for constipation (over-the-counter)

## 2012-04-18 NOTE — Progress Notes (Signed)
Faxed to PCP, Dr Fanta 

## 2012-04-27 ENCOUNTER — Encounter (HOSPITAL_COMMUNITY): Payer: Self-pay | Admitting: *Deleted

## 2012-04-27 ENCOUNTER — Encounter (HOSPITAL_COMMUNITY)
Admission: RE | Disposition: A | Discharge status: Home Health | Payer: Self-pay | Source: Ambulatory Visit | Attending: Gastroenterology

## 2012-04-27 ENCOUNTER — Ambulatory Visit (HOSPITAL_COMMUNITY)
Admission: RE | Admit: 2012-04-27 | Discharge: 2012-04-27 | Disposition: A | Discharge status: Home Health | Payer: 59 | Source: Ambulatory Visit | Attending: Gastroenterology | Admitting: Gastroenterology

## 2012-04-27 DIAGNOSIS — K5909 Other constipation: Secondary | ICD-10-CM

## 2012-04-27 DIAGNOSIS — Z79899 Other long term (current) drug therapy: Secondary | ICD-10-CM | POA: Insufficient documentation

## 2012-04-27 DIAGNOSIS — Z1211 Encounter for screening for malignant neoplasm of colon: Secondary | ICD-10-CM

## 2012-04-27 DIAGNOSIS — Z800 Family history of malignant neoplasm of digestive organs: Secondary | ICD-10-CM

## 2012-04-27 DIAGNOSIS — I10 Essential (primary) hypertension: Secondary | ICD-10-CM | POA: Insufficient documentation

## 2012-04-27 DIAGNOSIS — K648 Other hemorrhoids: Secondary | ICD-10-CM

## 2012-04-27 DIAGNOSIS — K5730 Diverticulosis of large intestine without perforation or abscess without bleeding: Secondary | ICD-10-CM

## 2012-04-27 DIAGNOSIS — E7800 Pure hypercholesterolemia, unspecified: Secondary | ICD-10-CM | POA: Insufficient documentation

## 2012-04-27 SURGERY — COLONOSCOPY
Anesthesia: Moderate Sedation

## 2012-04-27 MED ORDER — SODIUM CHLORIDE 0.45 % IV SOLN
Freq: Once | INTRAVENOUS | Status: AC
  Administered 2012-04-27: 10:00:00 via INTRAVENOUS

## 2012-04-27 MED ORDER — MEPERIDINE HCL 100 MG/ML IJ SOLN
INTRAMUSCULAR | Status: AC
  Filled 2012-04-27: qty 2

## 2012-04-27 MED ORDER — MIDAZOLAM HCL 5 MG/5ML IJ SOLN
INTRAMUSCULAR | Status: AC
  Filled 2012-04-27: qty 10

## 2012-04-27 MED ORDER — MEPERIDINE HCL 100 MG/ML IJ SOLN
INTRAMUSCULAR | Status: DC | PRN
  Administered 2012-04-27: 50 mg via INTRAVENOUS
  Administered 2012-04-27: 25 mg via INTRAVENOUS

## 2012-04-27 MED ORDER — STERILE WATER FOR IRRIGATION IR SOLN
Status: DC | PRN
  Administered 2012-04-27: 10:00:00

## 2012-04-27 MED ORDER — MIDAZOLAM HCL 5 MG/5ML IJ SOLN
INTRAMUSCULAR | Status: DC | PRN
  Administered 2012-04-27 (×2): 2 mg via INTRAVENOUS

## 2012-04-27 NOTE — Op Note (Signed)
[facility] [address] [place], [place]  [address]  COLONOSCOPY PROCEDURE REPORT  PATIENT:  Whitney, Patterson  MR#:  [id number] BIRTHDATE:  [birth year], 65 yrs. old  GENDER:  female  ENDOSCOPIST:  Jonette Eva, MD REF. BY:  Glenice Laine, M.D. ASSISTANT:  PROCEDURE DATE:  04/27/2012 PROCEDURE:  Colonoscopy 19147  INDICATIONS:  screening-father had colon ca AGE > 60  MEDICATIONS:   Demerol 75 mg IV, Versed 4 mg IV  DESCRIPTION OF PROCEDURE:    Physical exam was performed. Informed consent was obtained from the patient after explaining the benefits, risks, and alternatives to procedure.  The patient was connected to monitor and placed in left lateral position. Continuous oxygen was provided by nasal cannula and IV medicine administered through an indwelling cannula.  After administration of sedation and rectal exam, the patient's rectum was intubated and the EC-[id number] ([id number]) colonoscope was advanced under direct visualization to the cecum.  The scope was removed slowly by carefully examining the color, texture, anatomy, and integrity mucosa on the way out.  The patient was recovered in endoscopy and discharged home in satisfactory condition. <<PROCEDUREIMAGES>>  FINDINGS:  FREQUENT Diverticula were found in the sigmoid to descending colon segments.  OTHERWISE, normal colonoscopy without polyps, masses, vascular ectasias, or inflammatory changes.  SMALL Internal Hemorrhoids were found.  PREP QUALITY:    GOOD CECAL W/D TIME:     15  minutes  COMPLICATIONS:    None  ENDOSCOPIC IMPRESSION: 1) Internal hemorrhoids 2) Moderate left colon doverticulosis  RECOMMENDATIONS: HIGH FIBER DIET TCS IN 10 YEARS BECAUSE FATHER HAD COLON CA AGE > 60  REPEAT EXAM:  No  ______________________________ Jonette Eva, MD  CC:  Glenice Laine, M.D.  n. eSIGNED:   Codey Patterson at 04/27/2012 11:01 AM  Whitney Patterson, [id number]

## 2012-04-27 NOTE — H&P (Signed)
Primary Care Physician:  Avon Gully, MD Primary Gastroenterologist:  Dr. Darrick Penna  Pre-Procedure History & Physical: HPI:  Whitney Patterson is a 66 y.o. female here for FAMILY Hx COLON CA-FATHER HAD COLON CA AGE > 60.  Past Medical History  Diagnosis Date  . HTN (hypertension)   . Arthritis   . High cholesterol   . Heart burn   . Gastroesophageal reflux disease   . Sigmoid diverticulosis   . History of anemia   . Constipation     Past Surgical History  Procedure Date  . Foot surgery   . Stomach surgery   . Partial hysterectomy   . Esophagogastroduodenoscopy 09/15/2005    Normal examination of esophagus. Esophagus dilated by passing 54-French  Maloney dilator given history of dysphagia but without mucosal disruption/ Nonerosive antral gastritis  . Colonoscopy 09/15/2005    Few tiny diverticula at sigmoid colon, otherwise normal colonoscopy.  . Nasal sinus surgery      A BULLET REMOVED FROM HER RIGHT FRONTAL SINUS 20 + YEARS AGO     Prior to Admission medications   Medication Sig Start Date End Date Taking? Authorizing Provider  amLODipine (NORVASC) 10 MG tablet Take 10 mg by mouth every morning.    Yes Historical Provider, MD  lisinopril-hydrochlorothiazide (PRINZIDE,ZESTORETIC) 10-12.5 MG per tablet Take 1 tablet by mouth every morning.    Yes Historical Provider, MD  lovastatin (MEVACOR) 20 MG tablet Take 20 mg by mouth every morning.    Yes Historical Provider, MD  omeprazole (PRILOSEC) 20 MG capsule Take 20 mg by mouth every morning.    Yes Historical Provider, MD  Naproxen Sodium (ALEVE) 220 MG CAPS Take 2 capsules by mouth at bedtime.     Historical Provider, MD    Allergies as of 04/15/2012 - Review Complete 04/15/2012  Allergen Reaction Noted  . Codeine    . Penicillins    . Phenytoin Hives   . Sulfonamide derivatives      Family History  Problem Relation Age of Onset  . Heart disease    . Arthritis    . Cancer    . Diabetes    . Colon cancer Father 54     History   Social History  . Marital Status: Married    Spouse Name: N/A    Number of Children: 0  . Years of Education: 8th grade   Occupational History  . maid   .     Social History Main Topics  . Smoking status: Never Smoker   . Smokeless tobacco: Not on file  . Alcohol Use: No     heavy etoh x63yrs, quit 1991  . Drug Use: Not on file  . Sexually Active: Not on file   Other Topics Concern  . Not on file   Social History Narrative  . No narrative on file    Review of Systems: See HPI, otherwise negative ROS   Physical Exam: BP 154/77  Pulse 62  Temp 98.5 F (36.9 C) (Oral)  Resp 18  Ht 5\' 1"  (1.549 m)  Wt 171 lb (77.565 kg)  BMI 32.31 kg/m2  SpO2 97% General:   Alert,  pleasant and cooperative in NAD Head:  Normocephalic and atraumatic. Neck:  Supple;  Lungs:  Clear throughout to auscultation.    Heart:  Regular rate and rhythm. Abdomen:  Soft, nontender and nondistended. Normal bowel sounds, without guarding, and without rebound.   Neurologic:  Alert and  oriented x4;  grossly normal neurologically.  Impression/Plan:  FAMILY Hx COLON CA-FATHER HAD AGE > 60  PLAN:  1.TCS

## 2012-05-18 NOTE — Progress Notes (Signed)
JUL 2013 TCS L COLON TICS, IH  REVIEWED.

## 2012-05-27 ENCOUNTER — Ambulatory Visit (HOSPITAL_COMMUNITY)
Admission: RE | Admit: 2012-05-27 | Discharge: 2012-05-27 | Disposition: A | Discharge status: Home / Self Care | Payer: 59 | Source: Ambulatory Visit | Attending: Internal Medicine | Admitting: Internal Medicine

## 2012-05-27 ENCOUNTER — Other Ambulatory Visit (HOSPITAL_COMMUNITY): Payer: Self-pay | Admitting: Internal Medicine

## 2012-05-27 DIAGNOSIS — R52 Pain, unspecified: Secondary | ICD-10-CM

## 2012-05-27 DIAGNOSIS — M79609 Pain in unspecified limb: Secondary | ICD-10-CM | POA: Insufficient documentation

## 2012-07-22 ENCOUNTER — Other Ambulatory Visit (HOSPITAL_COMMUNITY): Payer: Self-pay | Admitting: Internal Medicine

## 2012-07-22 DIAGNOSIS — Z139 Encounter for screening, unspecified: Secondary | ICD-10-CM

## 2012-08-08 ENCOUNTER — Other Ambulatory Visit (HOSPITAL_COMMUNITY): Payer: Self-pay | Admitting: Internal Medicine

## 2012-08-08 ENCOUNTER — Ambulatory Visit (HOSPITAL_COMMUNITY)
Admission: RE | Admit: 2012-08-08 | Discharge: 2012-08-08 | Disposition: A | Discharge status: Home / Self Care | Payer: 59 | Source: Ambulatory Visit | Attending: Internal Medicine | Admitting: Internal Medicine

## 2012-08-08 DIAGNOSIS — R52 Pain, unspecified: Secondary | ICD-10-CM

## 2012-08-08 DIAGNOSIS — M25519 Pain in unspecified shoulder: Secondary | ICD-10-CM | POA: Insufficient documentation

## 2012-08-26 ENCOUNTER — Ambulatory Visit (HOSPITAL_COMMUNITY): Payer: 59

## 2012-08-29 ENCOUNTER — Ambulatory Visit (HOSPITAL_COMMUNITY)
Admission: RE | Admit: 2012-08-29 | Discharge: 2012-08-29 | Disposition: A | Discharge status: Home / Self Care | Payer: 59 | Source: Ambulatory Visit | Attending: Internal Medicine | Admitting: Internal Medicine

## 2012-08-29 DIAGNOSIS — Z139 Encounter for screening, unspecified: Secondary | ICD-10-CM

## 2012-08-29 DIAGNOSIS — Z1231 Encounter for screening mammogram for malignant neoplasm of breast: Secondary | ICD-10-CM | POA: Insufficient documentation

## 2012-09-13 ENCOUNTER — Ambulatory Visit (INDEPENDENT_AMBULATORY_CARE_PROVIDER_SITE_OTHER): Payer: 59 | Admitting: Orthopedic Surgery

## 2012-09-13 ENCOUNTER — Encounter: Payer: Self-pay | Admitting: Orthopedic Surgery

## 2012-09-13 VITALS — BP 110/56 | Ht 61.0 in | Wt 168.4 lb

## 2012-09-13 DIAGNOSIS — M719 Bursopathy, unspecified: Secondary | ICD-10-CM

## 2012-09-13 DIAGNOSIS — M67919 Unspecified disorder of synovium and tendon, unspecified shoulder: Secondary | ICD-10-CM

## 2012-09-13 MED ORDER — HYDROCODONE-ACETAMINOPHEN 5-325 MG PO TABS: 1.0000 | ORAL_TABLET | Freq: Four times a day (QID) | ORAL | Status: DC | PRN

## 2012-09-13 NOTE — Patient Instructions (Signed)
You have received a steroid shot. 15% of patients experience increased pain at the injection site with in the next 24 hours. This is best treated with ice and tylenol extra strength 2 tabs every 8 hours. If you are still having pain please call the office.   Home exercises

## 2012-09-13 NOTE — Progress Notes (Signed)
Patient ID: Whitney Patterson, female   DOB: [birth year], 66 y.o.   MRN: [id number] Chief Complaint  Patient presents with  . Shoulder Pain    Left shoulder pain started 3 months ago no injury ref by Dr. Felecia Shelling    3 mos h/o left shoulder pain and no trauma. Pain unrelieved by tramadol. Better with Aleve. Some numbness, swelling. Tends to come and go   Patient reported. Review of systems of chest pain, ordering of the shortness of breath, heartburn joint pain muscle pain, seasonal allergies.  Physical Exam(12)  Vital signs: BP 110/56  Ht 5\' 1"  (1.549 m)  Wt 168 lb 6.4 oz (76.386 kg)  BMI 31.82 kg/m2   GENERAL: normal development   CDV: pulses are normal   Skin: normal  Lymph: nodes were not palpable/normal  Psychiatric: awake, alert and oriented  Neuro: normal sensation  MSK  Gait: normal  1 Inspection left shoulder no tenderness 2 Range of Motion passive normal active 90 flexion 3 Motor 5-/5 4 Stability normal   Other side:  5 normal ROM  6 normal strength   Imaging x-rays show Acromio - Humeral distance is decreased   Assessment: RC disease     Plan: injection and therapy but she wants to do it herself  Subacromial Shoulder Injection Procedure Note  Pre-operative Diagnosis: left RC Syndrome  Post-operative Diagnosis: same  Indications: pain   Anesthesia: ethyl chloride   Procedure Details   Verbal consent was obtained for the procedure. The shoulder was prepped withalcohol and the skin was anesthetized. A 20 gauge needle was advanced into the subacromial space through posterior approach without difficulty  The space was then injected with 3 ml 1% lidocaine and 1 ml of depomedrol. The injection site was cleansed with isopropyl alcohol and a dressing was applied.  Complications:  None; patient tolerated the procedure well.

## 2013-08-28 ENCOUNTER — Other Ambulatory Visit (HOSPITAL_COMMUNITY): Payer: Self-pay | Admitting: Internal Medicine

## 2013-08-28 DIAGNOSIS — Z139 Encounter for screening, unspecified: Secondary | ICD-10-CM

## 2013-08-31 ENCOUNTER — Ambulatory Visit (HOSPITAL_COMMUNITY)
Admission: RE | Admit: 2013-08-31 | Discharge: 2013-08-31 | Disposition: A | Discharge status: Home / Self Care | Payer: BC Managed Care – PPO | Source: Ambulatory Visit | Attending: Internal Medicine | Admitting: Internal Medicine

## 2013-08-31 DIAGNOSIS — Z1231 Encounter for screening mammogram for malignant neoplasm of breast: Secondary | ICD-10-CM | POA: Insufficient documentation

## 2013-08-31 DIAGNOSIS — Z139 Encounter for screening, unspecified: Secondary | ICD-10-CM

## 2014-09-13 ENCOUNTER — Other Ambulatory Visit (HOSPITAL_COMMUNITY): Payer: Self-pay | Admitting: Internal Medicine

## 2014-09-13 DIAGNOSIS — Z1231 Encounter for screening mammogram for malignant neoplasm of breast: Secondary | ICD-10-CM

## 2014-09-26 ENCOUNTER — Ambulatory Visit (HOSPITAL_COMMUNITY)
Admission: RE | Admit: 2014-09-26 | Discharge: 2014-09-26 | Disposition: A | Discharge status: Home / Self Care | Payer: BC Managed Care – PPO | Source: Ambulatory Visit | Attending: Internal Medicine | Admitting: Internal Medicine

## 2014-09-26 DIAGNOSIS — Z1231 Encounter for screening mammogram for malignant neoplasm of breast: Secondary | ICD-10-CM | POA: Diagnosis not present

## 2015-08-01 ENCOUNTER — Emergency Department (HOSPITAL_COMMUNITY): Payer: BLUE CROSS/BLUE SHIELD

## 2015-08-01 ENCOUNTER — Encounter (HOSPITAL_COMMUNITY): Payer: Self-pay | Admitting: Emergency Medicine

## 2015-08-01 ENCOUNTER — Inpatient Hospital Stay (HOSPITAL_COMMUNITY)
Admission: EM | Admit: 2015-08-01 | Discharge: 2015-08-03 | DRG: 563 | Disposition: A | Discharge status: Home / Self Care | Payer: BLUE CROSS/BLUE SHIELD | Attending: Internal Medicine | Admitting: Internal Medicine

## 2015-08-01 DIAGNOSIS — Z833 Family history of diabetes mellitus: Secondary | ICD-10-CM | POA: Diagnosis not present

## 2015-08-01 DIAGNOSIS — E7800 Pure hypercholesterolemia, unspecified: Secondary | ICD-10-CM | POA: Diagnosis present

## 2015-08-01 DIAGNOSIS — S060X9A Concussion with loss of consciousness of unspecified duration, initial encounter: Secondary | ICD-10-CM

## 2015-08-01 DIAGNOSIS — M79622 Pain in left upper arm: Secondary | ICD-10-CM | POA: Diagnosis not present

## 2015-08-01 DIAGNOSIS — S42309A Unspecified fracture of shaft of humerus, unspecified arm, initial encounter for closed fracture: Secondary | ICD-10-CM | POA: Diagnosis present

## 2015-08-01 DIAGNOSIS — I10 Essential (primary) hypertension: Secondary | ICD-10-CM | POA: Diagnosis present

## 2015-08-01 DIAGNOSIS — M67919 Unspecified disorder of synovium and tendon, unspecified shoulder: Secondary | ICD-10-CM

## 2015-08-01 DIAGNOSIS — Z800 Family history of malignant neoplasm of digestive organs: Secondary | ICD-10-CM

## 2015-08-01 DIAGNOSIS — S060X0A Concussion without loss of consciousness, initial encounter: Secondary | ICD-10-CM | POA: Diagnosis present

## 2015-08-01 DIAGNOSIS — S42302A Unspecified fracture of shaft of humerus, left arm, initial encounter for closed fracture: Principal | ICD-10-CM | POA: Diagnosis present

## 2015-08-01 DIAGNOSIS — M1990 Unspecified osteoarthritis, unspecified site: Secondary | ICD-10-CM | POA: Diagnosis present

## 2015-08-01 DIAGNOSIS — K219 Gastro-esophageal reflux disease without esophagitis: Secondary | ICD-10-CM | POA: Diagnosis present

## 2015-08-01 DIAGNOSIS — Y92488 Other paved roadways as the place of occurrence of the external cause: Secondary | ICD-10-CM | POA: Diagnosis not present

## 2015-08-01 DIAGNOSIS — R51 Headache: Secondary | ICD-10-CM | POA: Diagnosis present

## 2015-08-01 DIAGNOSIS — S060XAA Concussion with loss of consciousness status unknown, initial encounter: Secondary | ICD-10-CM

## 2015-08-01 LAB — I-STAT CHEM 8, ED
BUN: 10 mg/dL (ref 6–20)
CREATININE: 0.8 mg/dL (ref 0.44–1.00)
Calcium, Ion: 1.06 mmol/L — ABNORMAL LOW (ref 1.13–1.30)
Chloride: 105 mmol/L (ref 101–111)
GLUCOSE: 116 mg/dL — AB (ref 65–99)
HCT: 43 % (ref 36.0–46.0)
HEMOGLOBIN: 14.6 g/dL (ref 12.0–15.0)
POTASSIUM: 3.6 mmol/L (ref 3.5–5.1)
Sodium: 140 mmol/L (ref 135–145)
TCO2: 21 mmol/L (ref 0–100)

## 2015-08-01 LAB — COMPREHENSIVE METABOLIC PANEL
ALBUMIN: 4.5 g/dL (ref 3.5–5.0)
ALK PHOS: 73 U/L (ref 38–126)
ALT: 19 U/L (ref 14–54)
ANION GAP: 12 (ref 5–15)
AST: 28 U/L (ref 15–41)
BILIRUBIN TOTAL: 0.7 mg/dL (ref 0.3–1.2)
BUN: 11 mg/dL (ref 6–20)
CALCIUM: 9.4 mg/dL (ref 8.9–10.3)
CO2: 22 mmol/L (ref 22–32)
CREATININE: 0.78 mg/dL (ref 0.44–1.00)
Chloride: 106 mmol/L (ref 101–111)
GFR calc Af Amer: >60 mL/min (ref >60)
GFR calc non Af Amer: >60 mL/min (ref >60)
GLUCOSE: 114 mg/dL — AB (ref 65–99)
Potassium: 3.6 mmol/L (ref 3.5–5.1)
Sodium: 140 mmol/L (ref 135–145)
TOTAL PROTEIN: 7.8 g/dL (ref 6.5–8.1)

## 2015-08-01 LAB — CBC WITH DIFFERENTIAL/PLATELET
BASOS ABS: 0 10*3/uL (ref 0.0–0.1)
BASOS PCT: 0 %
EOS ABS: 0.1 10*3/uL (ref 0.0–0.7)
EOS PCT: 1 %
HEMATOCRIT: 38.3 % (ref 36.0–46.0)
Hemoglobin: 12.4 g/dL (ref 12.0–15.0)
Lymphocytes Relative: 19 %
Lymphs Abs: 2.8 10*3/uL (ref 0.7–4.0)
MCH: 28.5 pg (ref 26.0–34.0)
MCHC: 32.4 g/dL (ref 30.0–36.0)
MCV: 88 fL (ref 78.0–100.0)
MONO ABS: 0.8 10*3/uL (ref 0.1–1.0)
MONOS PCT: 5 %
NEUTROS ABS: 11 10*3/uL — AB (ref 1.7–7.7)
Neutrophils Relative %: 75 %
PLATELETS: 318 10*3/uL (ref 150–400)
RBC: 4.35 MIL/uL (ref 3.87–5.11)
RDW: 13.5 % (ref 11.5–15.5)
WBC: 14.7 10*3/uL — ABNORMAL HIGH (ref 4.0–10.5)

## 2015-08-01 LAB — I-STAT CG4 LACTIC ACID, ED: Lactic Acid, Venous: 3.44 mmol/L (ref 0.5–2.0)

## 2015-08-01 LAB — TYPE AND SCREEN
ABO/RH(D): A POS
ANTIBODY SCREEN: NEGATIVE

## 2015-08-01 MED ORDER — ONDANSETRON HCL 4 MG/2ML IJ SOLN
4.0000 mg | Freq: Once | INTRAMUSCULAR | Status: AC
  Administered 2015-08-01: 4 mg via INTRAVENOUS
  Filled 2015-08-01: qty 2

## 2015-08-01 MED ORDER — ONDANSETRON HCL 4 MG/2ML IJ SOLN: 4.0000 mg | Freq: Four times a day (QID) | INTRAMUSCULAR | Status: DC | PRN

## 2015-08-01 MED ORDER — PANTOPRAZOLE SODIUM 40 MG PO TBEC
40.0000 mg | DELAYED_RELEASE_TABLET | Freq: Every day | ORAL | Status: DC
  Administered 2015-08-01 – 2015-08-03 (×3): 40 mg via ORAL
  Filled 2015-08-01 (×3): qty 1

## 2015-08-01 MED ORDER — SODIUM CHLORIDE 0.9 % IV SOLN
INTRAVENOUS | Status: DC
Start: 2015-08-01 — End: 2015-08-03
  Administered 2015-08-01 – 2015-08-02 (×2): via INTRAVENOUS

## 2015-08-01 MED ORDER — AMLODIPINE BESYLATE 5 MG PO TABS
10.0000 mg | ORAL_TABLET | Freq: Every morning | ORAL | Status: DC
  Administered 2015-08-02 – 2015-08-03 (×3): 10 mg via ORAL
  Filled 2015-08-01 (×2): qty 2

## 2015-08-01 MED ORDER — ENOXAPARIN SODIUM 40 MG/0.4ML ~~LOC~~ SOLN
40.0000 mg | SUBCUTANEOUS | Status: DC
  Administered 2015-08-01 – 2015-08-02 (×2): 40 mg via SUBCUTANEOUS
  Filled 2015-08-01 (×2): qty 0.4

## 2015-08-01 MED ORDER — FENTANYL CITRATE (PF) 100 MCG/2ML IJ SOLN
100.0000 ug | Freq: Once | INTRAMUSCULAR | Status: AC
  Administered 2015-08-01: 100 ug via INTRAVENOUS
  Filled 2015-08-01: qty 2

## 2015-08-01 MED ORDER — ONDANSETRON HCL 4 MG PO TABS: 4.0000 mg | ORAL_TABLET | Freq: Four times a day (QID) | ORAL | Status: DC | PRN

## 2015-08-01 MED ORDER — HYDROMORPHONE HCL 1 MG/ML IJ SOLN
0.5000 mg | INTRAMUSCULAR | Status: AC | PRN
  Administered 2015-08-01 – 2015-08-02 (×2): 0.5 mg via INTRAVENOUS
  Filled 2015-08-01 (×2): qty 1

## 2015-08-01 MED ORDER — IOHEXOL 300 MG/ML  SOLN
100.0000 mL | Freq: Once | INTRAMUSCULAR | Status: AC | PRN
  Administered 2015-08-01: 100 mL via INTRAVENOUS

## 2015-08-01 MED ORDER — FENTANYL CITRATE (PF) 100 MCG/2ML IJ SOLN
50.0000 ug | Freq: Once | INTRAMUSCULAR | Status: AC
  Administered 2015-08-01: 50 ug via INTRAVENOUS
  Filled 2015-08-01: qty 2

## 2015-08-01 MED ORDER — SODIUM CHLORIDE 0.9 % IJ SOLN: 3.0000 mL | Freq: Two times a day (BID) | INTRAMUSCULAR | Status: DC

## 2015-08-01 MED ORDER — NAPROXEN 250 MG PO TABS
500.0000 mg | ORAL_TABLET | Freq: Every day | ORAL | Status: DC
  Administered 2015-08-01 – 2015-08-02 (×2): 500 mg via ORAL
  Filled 2015-08-01 (×2): qty 2

## 2015-08-01 MED ORDER — LISINOPRIL 10 MG PO TABS
20.0000 mg | ORAL_TABLET | Freq: Two times a day (BID) | ORAL | Status: DC
Start: 2015-08-01 — End: 2015-08-03
  Administered 2015-08-01 – 2015-08-03 (×4): 20 mg via ORAL
  Filled 2015-08-01 (×4): qty 2

## 2015-08-01 MED ORDER — ONDANSETRON HCL 4 MG/2ML IJ SOLN: 4.0000 mg | Freq: Three times a day (TID) | INTRAMUSCULAR | Status: DC | PRN

## 2015-08-01 MED ORDER — PRAVASTATIN SODIUM 10 MG PO TABS
20.0000 mg | ORAL_TABLET | Freq: Every day | ORAL | Status: DC
  Administered 2015-08-01 – 2015-08-02 (×2): 20 mg via ORAL
  Filled 2015-08-01 (×2): qty 2

## 2015-08-01 NOTE — ED Notes (Signed)
Patient transported to X-ray 
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
## 2015-08-01 NOTE — ED Notes (Signed)
MD gave order to discontinue PT/INR and 3 hour repeat I-Stat Lactic Acid.

## 2015-08-01 NOTE — ED Notes (Signed)
Attempted to ambulate pt per MD. Nursing staff able to assist pt to side of bed with 2 person assist. Pt c/o increased dizziness and drowsiness. Pt assisted back to lying position. MD notified.

## 2015-08-02 DIAGNOSIS — S42309A Unspecified fracture of shaft of humerus, unspecified arm, initial encounter for closed fracture: Secondary | ICD-10-CM | POA: Insufficient documentation

## 2015-08-02 LAB — CBC
HEMATOCRIT: 36.4 % (ref 36.0–46.0)
HEMOGLOBIN: 11.7 g/dL — AB (ref 12.0–15.0)
MCH: 28.3 pg (ref 26.0–34.0)
MCHC: 32.1 g/dL (ref 30.0–36.0)
MCV: 87.9 fL (ref 78.0–100.0)
Platelets: 317 10*3/uL (ref 150–400)
RBC: 4.14 MIL/uL (ref 3.87–5.11)
RDW: 14 % (ref 11.5–15.5)
WBC: 8.7 10*3/uL (ref 4.0–10.5)

## 2015-08-02 LAB — COMPREHENSIVE METABOLIC PANEL
ALBUMIN: 3.9 g/dL (ref 3.5–5.0)
ALK PHOS: 59 U/L (ref 38–126)
ALT: 17 U/L (ref 14–54)
ANION GAP: 10 (ref 5–15)
AST: 26 U/L (ref 15–41)
BUN: 11 mg/dL (ref 6–20)
CO2: 24 mmol/L (ref 22–32)
Calcium: 8.8 mg/dL — ABNORMAL LOW (ref 8.9–10.3)
Chloride: 105 mmol/L (ref 101–111)
Creatinine, Ser: 0.68 mg/dL (ref 0.44–1.00)
GFR calc Af Amer: >60 mL/min (ref >60)
GFR calc non Af Amer: >60 mL/min (ref >60)
GLUCOSE: 108 mg/dL — AB (ref 65–99)
POTASSIUM: 4.1 mmol/L (ref 3.5–5.1)
SODIUM: 139 mmol/L (ref 135–145)
Total Bilirubin: 1.4 mg/dL — ABNORMAL HIGH (ref 0.3–1.2)
Total Protein: 6.8 g/dL (ref 6.5–8.1)

## 2015-08-02 LAB — GLUCOSE, CAPILLARY: Glucose-Capillary: 118 mg/dL — ABNORMAL HIGH (ref 65–99)

## 2015-08-02 MED ORDER — HYDROCODONE-ACETAMINOPHEN 5-325 MG PO TABS
1.0000 | ORAL_TABLET | Freq: Four times a day (QID) | ORAL | Status: DC | PRN
  Administered 2015-08-02 – 2015-08-03 (×3): 1 via ORAL
  Filled 2015-08-02 (×3): qty 1

## 2015-08-02 MED ORDER — POTASSIUM CHLORIDE CRYS ER 20 MEQ PO TBCR
40.0000 meq | EXTENDED_RELEASE_TABLET | Freq: Once | ORAL | Status: AC
  Administered 2015-08-02: 40 meq via ORAL
  Filled 2015-08-02: qty 2
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
## 2015-08-02 NOTE — Care Management Note (Signed)
Case Management Note  Patient Details  Name: ROMIE KEEBLE MRN: [id number] Date of Birth: [birth year]  Subjective/Objective:                  Pt from home with husband, has very supportive family. Pt has no HH services prior to admission.   Action/Plan: Pt plans to return home with self care at DC. Pt has sling, no other DME anticipated. NoCM needs anticipated.   Expected Discharge Date:   08/03/2015               Expected Discharge Plan:  Home/Self Care  In-House Referral:  NA  Discharge planning Services  CM Consult  Post Acute Care Choice:  NA Choice offered to:  NA  DME Arranged:    DME Agency:     HH Arranged:    HH Agency:     Status of Service:  Completed, signed off  Medicare Important Message Given:    Date Medicare IM Given:    Medicare IM give by:    Date Additional Medicare IM Given:    Additional Medicare Important Message give by:     If discussed at [place] of Stay Meetings, dates discussed:    Additional Comments:  Sherald Barge, RN 08/02/2015, 4:01 PM
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
## 2015-08-03 MED ORDER — HYDROCODONE-ACETAMINOPHEN 5-325 MG PO TABS: 1.0000 | ORAL_TABLET | Freq: Four times a day (QID) | ORAL | Status: DC | PRN

## 2015-08-03 NOTE — Progress Notes (Signed)
Pt discharged via wheelchair into the care of her sister Donia Guiles via private vehicle.  Pt stable with no S&S of distress noted.  VS: WNL.  PIV removed with catheter intact and no S&S of infection noted.  Discharge instructions reviewed with pt/sister.  Pt/sister verbalized understanding.  Prescription for narcotics given to pt/sister.  *

## 2015-08-03 NOTE — Progress Notes (Signed)
She feels well and wants to go home. She still has a headache. She still has pain in her fractured arm. No other new complaints. Orthopedic consultation noted and appreciated she's going to be discharged home today
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
## 2015-08-13 ENCOUNTER — Ambulatory Visit (INDEPENDENT_AMBULATORY_CARE_PROVIDER_SITE_OTHER): Payer: BLUE CROSS/BLUE SHIELD

## 2015-08-13 ENCOUNTER — Ambulatory Visit (INDEPENDENT_AMBULATORY_CARE_PROVIDER_SITE_OTHER): Payer: Self-pay | Admitting: Orthopedic Surgery

## 2015-08-13 VITALS — Ht 61.0 in | Wt 171.0 lb

## 2015-08-13 DIAGNOSIS — S42202D Unspecified fracture of upper end of left humerus, subsequent encounter for fracture with routine healing: Secondary | ICD-10-CM

## 2015-08-13 DIAGNOSIS — S42302D Unspecified fracture of shaft of humerus, left arm, subsequent encounter for fracture with routine healing: Secondary | ICD-10-CM
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
## 2015-08-14 ENCOUNTER — Telehealth: Payer: Self-pay | Admitting: *Deleted

## 2015-08-14 NOTE — Telephone Encounter (Signed)
I made patient aware that I spoke with Whitney Patterson, and Whitney Patterson told me it was fine to let her know to hold her left hand above her heart that will help with the swelling and if her ace bandage wrap feels to tight around her left arm she can loosen it a little bit. I made patient aware that if it does not help to call us back and let us know. Patient verbally stated she understands, and she will call back if it doesn't help.

## 2015-08-14 NOTE — Telephone Encounter (Signed)
Patient called stating her left hand is swelling, patient wants to know what would Dr. Aline Brochure recommend for the swelling. Please advise

## 2015-08-14 NOTE — Telephone Encounter (Signed)
Advise to elevate hand and also to ensure that ace wrap is not to tight, if swelling continues contact our office

## 2015-08-20 ENCOUNTER — Ambulatory Visit (INDEPENDENT_AMBULATORY_CARE_PROVIDER_SITE_OTHER): Payer: Self-pay | Admitting: Orthopedic Surgery

## 2015-08-20 VITALS — BP 145/74 | Ht 61.0 in | Wt 171.0 lb

## 2015-08-20 DIAGNOSIS — S42302D Unspecified fracture of shaft of humerus, left arm, subsequent encounter for fracture with routine healing: Secondary | ICD-10-CM

## 2015-08-20 MED ORDER — TRAMADOL HCL 50 MG PO TABS: 50.0000 mg | ORAL_TABLET | Freq: Four times a day (QID) | ORAL | Status: DC | PRN
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
## 2015-09-10 ENCOUNTER — Ambulatory Visit (INDEPENDENT_AMBULATORY_CARE_PROVIDER_SITE_OTHER): Payer: BLUE CROSS/BLUE SHIELD

## 2015-09-10 ENCOUNTER — Ambulatory Visit (INDEPENDENT_AMBULATORY_CARE_PROVIDER_SITE_OTHER): Payer: Self-pay | Admitting: Orthopedic Surgery

## 2015-09-10 VITALS — BP 150/79 | Ht 61.0 in | Wt 171.0 lb

## 2015-09-10 DIAGNOSIS — S42202D Unspecified fracture of upper end of left humerus, subsequent encounter for fracture with routine healing: Secondary | ICD-10-CM

## 2015-09-10 NOTE — Progress Notes (Signed)
Fracture care follow-up  Chief Complaint  Patient presents with  . Follow-up    3 week recheck on left humerus fracture with xray. DOI 08-02-15.     This is week 5 status post midshaft humerus fracture. Patient's pain is controlled tramadol   fracture cuff fitting appropriately.  Patient had x-rays today show fracture healing with appropriate alignment   Patient's radial nerve function tests intact  Recommend x-ray in 4 weeks in the fracture cuff.

## 2015-10-10 ENCOUNTER — Ambulatory Visit (INDEPENDENT_AMBULATORY_CARE_PROVIDER_SITE_OTHER): Payer: Self-pay | Admitting: Orthopedic Surgery

## 2015-10-10 ENCOUNTER — Ambulatory Visit (HOSPITAL_COMMUNITY)
Admission: RE | Admit: 2015-10-10 | Discharge: 2015-10-10 | Disposition: A | Discharge status: Home / Self Care | Payer: BLUE CROSS/BLUE SHIELD | Source: Ambulatory Visit | Attending: Orthopedic Surgery | Admitting: Orthopedic Surgery

## 2015-10-10 ENCOUNTER — Other Ambulatory Visit: Payer: Self-pay | Admitting: Orthopedic Surgery

## 2015-10-10 DIAGNOSIS — S42302D Unspecified fracture of shaft of humerus, left arm, subsequent encounter for fracture with routine healing: Secondary | ICD-10-CM

## 2015-10-10 DIAGNOSIS — S42202A Unspecified fracture of upper end of left humerus, initial encounter for closed fracture: Secondary | ICD-10-CM

## 2015-10-10 DIAGNOSIS — M84422D Pathological fracture, left humerus, subsequent encounter for fracture with routine healing: Secondary | ICD-10-CM | POA: Diagnosis present
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
## 2015-11-11 ENCOUNTER — Ambulatory Visit (INDEPENDENT_AMBULATORY_CARE_PROVIDER_SITE_OTHER): Payer: BLUE CROSS/BLUE SHIELD | Admitting: Orthopedic Surgery

## 2015-11-11 ENCOUNTER — Ambulatory Visit (INDEPENDENT_AMBULATORY_CARE_PROVIDER_SITE_OTHER): Payer: BLUE CROSS/BLUE SHIELD

## 2015-11-11 ENCOUNTER — Encounter: Payer: Self-pay | Admitting: Orthopedic Surgery

## 2015-11-11 VITALS — BP 163/88 | Ht 61.0 in | Wt 158.0 lb

## 2015-11-11 DIAGNOSIS — S42202D Unspecified fracture of upper end of left humerus, subsequent encounter for fracture with routine healing: Secondary | ICD-10-CM

## 2015-11-11 NOTE — Progress Notes (Signed)
Patient ID: Whitney Patterson, female   DOB: September 26, 1946, 70 y.o.   MRN: [id number]  Chief Complaint  Patient presents with  . Follow-up    follow up + xray left humerus fx, DOI 08/02/15    BP 163/88 mmHg  Ht 5\' 1"  (1.549 m)  Wt 158 lb (71.668 kg)  BMI 29.87 kg/m2  14 weeks 3 days status post midshaft humerus fracture. Treated in fracture cuff. X-rays today show fracture is healing nicely. Alignment is excellent. Good callus formation is seen.  Review of systems negative for numbness or tingling in the left upper and there are no problems with the skin related to the fracture,  She is awake alert and oriented 3 mood and affect are normal blood pressure and vital signs are stable  Patient's radial nerve checked and function is normal. Mild tenderness at the fracture site described more as discomfort to deep palpation no motion at the fracture site  Wrist hand range of motion normal passive range of motion shoulder normal as well. No instability noted shoulder elbow wrist  X-rays are obtained and report is available for review fracture healing well with normal alignment  ASSESSMENT AND PLAN   Four more weeks in the fracture cuff next x-ray out of the fracture cuff in 4 weeks

## 2015-12-09 ENCOUNTER — Encounter: Payer: Self-pay | Admitting: Orthopedic Surgery

## 2015-12-09 ENCOUNTER — Ambulatory Visit (INDEPENDENT_AMBULATORY_CARE_PROVIDER_SITE_OTHER): Payer: BLUE CROSS/BLUE SHIELD

## 2015-12-09 ENCOUNTER — Ambulatory Visit (INDEPENDENT_AMBULATORY_CARE_PROVIDER_SITE_OTHER): Payer: BLUE CROSS/BLUE SHIELD | Admitting: Orthopedic Surgery

## 2015-12-09 VITALS — BP 154/80 | Ht 61.0 in | Wt 159.6 lb

## 2015-12-09 DIAGNOSIS — S42202D Unspecified fracture of upper end of left humerus, subsequent encounter for fracture with routine healing: Secondary | ICD-10-CM

## 2015-12-09 NOTE — Progress Notes (Signed)
Patient ID: Whitney Patterson, female   DOB: [birth year], 70 y.o.   MRN: [id number]  Chief Complaint  Patient presents with  . Follow-up    follow up + xrat Left humerus fx, DOI 08/02/15    HPI patient had a midshaft humerus fracture treated with a fracture cuff  Review of Systems  Musculoskeletal:       Weakness and decreased range of motion left shoulder  Neurological: Negative for tingling.    BP 154/80 mmHg  Ht 5\' 1"  (1.549 m)  Wt 159 lb 9.6 oz (72.394 kg)  BMI 30.17 kg/m2  Physical Exam  Constitutional: She is oriented to person, place, and time and well-developed, well-nourished, and in no distress.  Cardiovascular: Intact distal pulses.   Musculoskeletal:       Right shoulder: She exhibits decreased range of motion.       Left shoulder: She exhibits decreased range of motion.       Arms: There is no tenderness at the fracture site  Neurological: She is alert and oriented to person, place, and time.  Skin: Skin is warm and dry. She is not diaphoretic.  Psychiatric: Affect and judgment normal.       ASSESSMENT AND PLAN   X-rays today  Here is my report AP lateral left humerus status post left humerus fracture  Midshaft left humerus fracture shows bridging callus on both x-rays with minimal angulation less than 10. Compared to the prior film there is more progression towards healing and the fractures considered healed if clinical exam reveals no tenderness  Recommend follow-up in 2 months for shoulder rating.

## 2015-12-09 NOTE — Patient Instructions (Signed)
The patient is advised to start shoulder range of motion exercises We will see her in 2 months at that time we can give her a shoulder rating

## 2015-12-19 ENCOUNTER — Other Ambulatory Visit (HOSPITAL_COMMUNITY): Payer: Self-pay | Admitting: Internal Medicine

## 2015-12-19 DIAGNOSIS — M1990 Unspecified osteoarthritis, unspecified site: Secondary | ICD-10-CM

## 2015-12-19 DIAGNOSIS — Z780 Asymptomatic menopausal state: Secondary | ICD-10-CM

## 2015-12-23 ENCOUNTER — Ambulatory Visit (HOSPITAL_COMMUNITY)
Admission: RE | Admit: 2015-12-23 | Discharge: 2015-12-23 | Disposition: A | Discharge status: Home / Self Care | Payer: BLUE CROSS/BLUE SHIELD | Source: Ambulatory Visit | Attending: Internal Medicine | Admitting: Internal Medicine

## 2015-12-23 DIAGNOSIS — M1990 Unspecified osteoarthritis, unspecified site: Secondary | ICD-10-CM | POA: Insufficient documentation

## 2015-12-23 DIAGNOSIS — M810 Age-related osteoporosis without current pathological fracture: Secondary | ICD-10-CM | POA: Diagnosis not present

## 2015-12-23 DIAGNOSIS — Z780 Asymptomatic menopausal state: Secondary | ICD-10-CM | POA: Insufficient documentation

## 2016-01-20 ENCOUNTER — Ambulatory Visit: Payer: Medicare Other | Admitting: Orthopedic Surgery

## 2016-02-03 ENCOUNTER — Ambulatory Visit (INDEPENDENT_AMBULATORY_CARE_PROVIDER_SITE_OTHER): Payer: BLUE CROSS/BLUE SHIELD | Admitting: Orthopedic Surgery

## 2016-02-03 ENCOUNTER — Encounter: Payer: Self-pay | Admitting: Orthopedic Surgery

## 2016-02-03 VITALS — BP 162/84 | Ht 61.0 in | Wt 159.0 lb

## 2016-02-03 DIAGNOSIS — S42202D Unspecified fracture of upper end of left humerus, subsequent encounter for fracture with routine healing: Secondary | ICD-10-CM | POA: Diagnosis not present
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
## 2016-02-07 ENCOUNTER — Telehealth: Payer: Self-pay | Admitting: Orthopedic Surgery

## 2016-02-07 NOTE — Telephone Encounter (Signed)
I spoke with patient and relayed that upon coming in to sign a release of information, she may get copy of office note as noted per Dr Aline Brochure, however, we are unable to release directly to another party without a signed release from that party. Patient will come by early next week to sign and have released to her directly.

## 2016-02-07 NOTE — Telephone Encounter (Signed)
-----   Message from Carole Civil, MD sent at 02/03/2016 10:40 AM EDT ----- Regarding: Letter to lawyer Letter Bud Face  Actually just in the last office note [id number]

## 2016-03-26 ENCOUNTER — Other Ambulatory Visit (HOSPITAL_COMMUNITY): Payer: Self-pay | Admitting: Internal Medicine

## 2016-03-26 DIAGNOSIS — Z1231 Encounter for screening mammogram for malignant neoplasm of breast: Secondary | ICD-10-CM

## 2016-04-02 ENCOUNTER — Other Ambulatory Visit (HOSPITAL_COMMUNITY): Payer: Self-pay | Admitting: Internal Medicine

## 2016-04-02 ENCOUNTER — Ambulatory Visit (HOSPITAL_COMMUNITY)
Admission: RE | Admit: 2016-04-02 | Discharge: 2016-04-02 | Disposition: A | Discharge status: Home / Self Care | Payer: BLUE CROSS/BLUE SHIELD | Source: Ambulatory Visit | Attending: Internal Medicine | Admitting: Internal Medicine

## 2016-04-02 ENCOUNTER — Ambulatory Visit (HOSPITAL_COMMUNITY): Payer: BLUE CROSS/BLUE SHIELD

## 2016-04-02 DIAGNOSIS — Z1231 Encounter for screening mammogram for malignant neoplasm of breast: Secondary | ICD-10-CM

## 2016-04-20 ENCOUNTER — Telehealth: Payer: Self-pay
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
## 2017-03-17 ENCOUNTER — Other Ambulatory Visit (HOSPITAL_COMMUNITY): Payer: Self-pay | Admitting: Internal Medicine

## 2017-03-17 DIAGNOSIS — Z1231 Encounter for screening mammogram for malignant neoplasm of breast: Secondary | ICD-10-CM

## 2017-03-26 ENCOUNTER — Ambulatory Visit (HOSPITAL_COMMUNITY)
Admission: RE | Admit: 2017-03-26 | Discharge: 2017-03-26 | Disposition: A | Discharge status: Home / Self Care | Payer: Managed Care, Other (non HMO) | Source: Ambulatory Visit | Attending: Internal Medicine | Admitting: Internal Medicine

## 2017-03-26 DIAGNOSIS — Z1231 Encounter for screening mammogram for malignant neoplasm of breast: Secondary | ICD-10-CM | POA: Diagnosis present

## 2017-04-05 ENCOUNTER — Other Ambulatory Visit (HOSPITAL_COMMUNITY): Payer: Self-pay | Admitting: Internal Medicine

## 2017-04-05 DIAGNOSIS — N6310 Unspecified lump in the right breast, unspecified quadrant: Secondary | ICD-10-CM

## 2017-04-13 ENCOUNTER — Ambulatory Visit (HOSPITAL_COMMUNITY)
Admission: RE | Admit: 2017-04-13 | Discharge: 2017-04-13 | Disposition: A | Discharge status: Home / Self Care | Payer: Managed Care, Other (non HMO) | Source: Ambulatory Visit | Attending: Internal Medicine | Admitting: Internal Medicine

## 2017-04-13 DIAGNOSIS — N6310 Unspecified lump in the right breast, unspecified quadrant: Secondary | ICD-10-CM | POA: Diagnosis present

## 2017-04-13 DIAGNOSIS — R928 Other abnormal and inconclusive findings on diagnostic imaging of breast: Secondary | ICD-10-CM | POA: Diagnosis not present

## 2017-04-13 DIAGNOSIS — N6489 Other specified disorders of breast: Secondary | ICD-10-CM | POA: Insufficient documentation

## 2017-05-03 NOTE — Telephone Encounter (Signed)
Sign off

## 2018-08-24 ENCOUNTER — Ambulatory Visit: Payer: Managed Care, Other (non HMO) | Admitting: Orthopedic Surgery

## 2018-08-24 ENCOUNTER — Ambulatory Visit (INDEPENDENT_AMBULATORY_CARE_PROVIDER_SITE_OTHER): Payer: Managed Care, Other (non HMO)

## 2018-08-24 ENCOUNTER — Encounter: Payer: Self-pay | Admitting: Orthopedic Surgery

## 2018-08-24 VITALS — BP 132/76 | HR 77 | Ht 61.0 in | Wt 155.0 lb

## 2018-08-24 DIAGNOSIS — M25521 Pain in right elbow: Secondary | ICD-10-CM

## 2018-08-24 DIAGNOSIS — M47812 Spondylosis without myelopathy or radiculopathy, cervical region: Secondary | ICD-10-CM

## 2018-08-24 DIAGNOSIS — M7521 Bicipital tendinitis, right shoulder: Secondary | ICD-10-CM | POA: Diagnosis not present

## 2018-08-24 NOTE — Progress Notes (Signed)
NEW PROBLEM OFFICE VISIT  Chief Complaint  Patient presents with  . Arm Pain    Right upper arm pain sending numbness into fingers for 2 months, getting worse.    72 years old presents for evaluation of right arm pain radiating into the elbow grabs her biceps tendon and right shoulder in the peri-acromial region.  She is had pain for 2 months or more no trauma complains of intermittent symptoms with weakness and throbbing seems to be worse when she is trying to pick up her grandchild.  To dull pain it is associated with some numbness and tingling in the fingers but no neck pain   Review of Systems  Constitutional: Negative for chills, fever, malaise/fatigue and weight loss.  Musculoskeletal: Negative for neck pain.  Neurological: Positive for tingling and weakness.     Past Medical History:  Diagnosis Date  . Arthritis   . Constipation   . Gastroesophageal reflux disease   . Heart burn   . High cholesterol   . History of anemia   . HTN (hypertension)   . Sigmoid diverticulosis     Past Surgical History:  Procedure Laterality Date  . COLONOSCOPY  09/15/2005   Few tiny diverticula at sigmoid colon, otherwise normal colonoscopy.  . ESOPHAGOGASTRODUODENOSCOPY  09/15/2005   Normal examination of esophagus. Esophagus dilated by passing 54-French  Maloney dilator given history of dysphagia but without mucosal disruption/ Nonerosive antral gastritis  . FOOT SURGERY    . NASAL SINUS SURGERY      A BULLET REMOVED FROM HER RIGHT FRONTAL SINUS 20 + YEARS AGO   . PARTIAL HYSTERECTOMY    . STOMACH SURGERY      Family History  Problem Relation Age of Onset  . Heart disease Unknown   . Arthritis Unknown   . Cancer Unknown   . Diabetes Unknown   . Colon cancer Father 38   Social History   Tobacco Use  . Smoking status: Never Smoker  . Smokeless tobacco: Never Used  Substance Use Topics  . Alcohol use: No    Comment: heavy etoh x77yrs, quit 1991  . Drug use: No     Allergies  Allergen Reactions  . Codeine     REACTION: unknown reaction  . Penicillins     REACTION: unknown reaction  . Phenytoin Hives  . Sulfonamide Derivatives     REACTION: unknown reaction    Current Meds  Medication Sig  . alendronate (FOSAMAX) 70 MG tablet Take 70 mg by mouth once a week.  [name] Kitchen amLODipine (NORVASC) 10 MG tablet Take 10 mg by mouth every morning.   [name] Kitchen lisinopril (PRINIVIL,ZESTRIL) 20 MG tablet Take 20 mg by mouth 2 (two) times daily.  [name] Kitchen lovastatin (MEVACOR) 20 MG tablet Take 20 mg by mouth every morning.   [name] Kitchen omeprazole (PRILOSEC) 20 MG capsule Take 20 mg by mouth every morning.     BP 132/76   Pulse 77   Ht 5\' 1"  (1.549 m)   Wt 155 lb (70.3 kg)   BMI 29.29 kg/m   Physical Exam  Constitutional: She is oriented to person, place, and time. She appears well-developed and well-nourished.  Neurological: She is alert and oriented to person, place, and time.  Reflex Scores:      Bicep reflexes are 2+ on the right side and 2+ on the left side. Psychiatric: She has a normal mood and affect. Judgment normal.  Vitals reviewed.   Right Shoulder Exam  Right shoulder exam is normal.  Tenderness  The patient is experiencing tenderness in the biceps tendon.  Range of Motion  The patient has normal right shoulder ROM.  Muscle Strength  The patient has normal right shoulder strength.  Tests  Apprehension: negative Impingement: negative Sulcus: absent  Other  Erythema: absent Sensation: normal Pulse: present   Left Shoulder Exam  Left shoulder exam is normal.  Tenderness  The patient is experiencing no tenderness.   Range of Motion  The patient has normal left shoulder ROM.  Muscle Strength  The patient has normal left shoulder strength.  Tests  Apprehension: negative  Other  Erythema: absent Sensation: normal Pulse: present      Cervical spine nontender   MEDICAL DECISION SECTION  Xrays were done at [facility] SEE DICTATED  REPORT  Moderate cervical spondylosis    Encounter Diagnoses  Name Primary?  [name] Kitchen Arthralgia of right upper arm   . Cervical spondylosis   . Tendonitis of long head of biceps brachii of right shoulder Yes   Primarily has tenderness over the biceps tendon with positive Speed test.  Neck does not seem to be bothering her although she has complaints of intermittent throbbing and numbness in the fingers.  X-ray shows spondylosis cervical spine currently seems to be asymptomatic  PLAN: (Rx., injectx, surgery, frx, mri/ct)  Procedure note the subacromial injection shoulder RIGHT  Verbal consent was obtained to inject the  RIGHT   Shoulder  Timeout was completed to confirm the injection site is a subacromial space of the  RIGHT  shoulder   Medication used Depo-Medrol 40 mg and lidocaine 1% 3 cc  Anesthesia was provided by ethyl chloride  The injection was performed in the RIGHT  posterior subacromial space. After pinning the skin with alcohol and anesthetized the skin with ethyl chloride the subacromial space was injected using a 20-gauge needle. There were no complications  Sterile dressing was applied.    No orders of the defined types were placed in this encounter.   Arther Abbott, MD  08/24/2018 2:08 PM

## 2018-08-24 NOTE — Patient Instructions (Signed)
IF NOT BETTER IN 2 WEEKS THEN CALL us TO COME BACK FOR RECHECK

## 2019-03-15 ENCOUNTER — Other Ambulatory Visit: Payer: Self-pay

## 2019-03-15 ENCOUNTER — Other Ambulatory Visit (HOSPITAL_COMMUNITY): Payer: Self-pay | Admitting: Internal Medicine

## 2019-03-15 ENCOUNTER — Ambulatory Visit (HOSPITAL_COMMUNITY)
Admission: RE | Admit: 2019-03-15 | Discharge: 2019-03-15 | Disposition: A | Discharge status: Home / Self Care | Payer: Managed Care, Other (non HMO) | Source: Ambulatory Visit | Attending: Internal Medicine | Admitting: Internal Medicine

## 2019-03-15 DIAGNOSIS — M2560 Stiffness of unspecified joint, not elsewhere classified: Secondary | ICD-10-CM

## 2019-03-15 DIAGNOSIS — R52 Pain, unspecified: Secondary | ICD-10-CM | POA: Insufficient documentation

## 2019-03-15 DIAGNOSIS — R29898 Other symptoms and signs involving the musculoskeletal system: Secondary | ICD-10-CM | POA: Insufficient documentation

## 2019-03-29 ENCOUNTER — Ambulatory Visit (INDEPENDENT_AMBULATORY_CARE_PROVIDER_SITE_OTHER): Payer: Medicare Other | Admitting: Orthopedic Surgery

## 2019-03-29 ENCOUNTER — Other Ambulatory Visit: Payer: Self-pay

## 2019-03-29 ENCOUNTER — Encounter: Payer: Self-pay | Admitting: Orthopedic Surgery

## 2019-03-29 VITALS — BP 136/79 | HR 76 | Temp 98.3°F | Ht 61.0 in | Wt 154.0 lb

## 2019-03-29 DIAGNOSIS — M25511 Pain in right shoulder: Secondary | ICD-10-CM | POA: Diagnosis not present

## 2019-03-29 DIAGNOSIS — M12812 Other specific arthropathies, not elsewhere classified, left shoulder: Secondary | ICD-10-CM

## 2019-03-29 DIAGNOSIS — M25512 Pain in left shoulder: Secondary | ICD-10-CM | POA: Diagnosis not present

## 2019-03-29 DIAGNOSIS — M12811 Other specific arthropathies, not elsewhere classified, right shoulder: Secondary | ICD-10-CM

## 2019-03-29 DIAGNOSIS — G8929 Other chronic pain: Secondary | ICD-10-CM

## 2019-03-29 MED ORDER — TRAMADOL HCL 50 MG PO TABS: 50.0000 mg | ORAL_TABLET | Freq: Four times a day (QID) | ORAL | 5 refills | Status: DC | PRN
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
## 2019-03-29 NOTE — Patient Instructions (Signed)

## 2019-05-30 ENCOUNTER — Other Ambulatory Visit (HOSPITAL_COMMUNITY): Payer: Self-pay | Admitting: Internal Medicine

## 2019-05-30 DIAGNOSIS — I10 Essential (primary) hypertension: Secondary | ICD-10-CM | POA: Diagnosis not present

## 2019-05-30 DIAGNOSIS — Z0001 Encounter for general adult medical examination with abnormal findings: Secondary | ICD-10-CM | POA: Diagnosis not present

## 2019-05-30 DIAGNOSIS — E785 Hyperlipidemia, unspecified: Secondary | ICD-10-CM | POA: Diagnosis not present

## 2019-05-30 DIAGNOSIS — M170 Bilateral primary osteoarthritis of knee: Secondary | ICD-10-CM | POA: Diagnosis not present

## 2019-05-30 DIAGNOSIS — M810 Age-related osteoporosis without current pathological fracture: Secondary | ICD-10-CM | POA: Diagnosis not present

## 2019-05-30 DIAGNOSIS — Z1231 Encounter for screening mammogram for malignant neoplasm of breast: Secondary | ICD-10-CM

## 2019-05-30 DIAGNOSIS — Z23 Encounter for immunization: Secondary | ICD-10-CM | POA: Diagnosis not present

## 2019-06-15 ENCOUNTER — Other Ambulatory Visit: Payer: Self-pay

## 2019-06-15 ENCOUNTER — Ambulatory Visit (HOSPITAL_COMMUNITY)
Admission: RE | Admit: 2019-06-15 | Discharge: 2019-06-15 | Disposition: A | Discharge status: Home / Self Care | Payer: Medicare Other | Source: Ambulatory Visit | Attending: Internal Medicine | Admitting: Internal Medicine

## 2019-06-15 DIAGNOSIS — Z1231 Encounter for screening mammogram for malignant neoplasm of breast: Secondary | ICD-10-CM

## 2019-07-03 ENCOUNTER — Other Ambulatory Visit: Payer: Self-pay

## 2019-07-03 ENCOUNTER — Encounter (HOSPITAL_COMMUNITY): Payer: Self-pay

## 2019-07-03 ENCOUNTER — Emergency Department (HOSPITAL_COMMUNITY)
Admission: EM | Admit: 2019-07-03 | Discharge: 2019-07-03 | Disposition: A | Discharge status: Home / Self Care | Payer: Medicare Other | Attending: Emergency Medicine | Admitting: Emergency Medicine

## 2019-07-03 DIAGNOSIS — I10 Essential (primary) hypertension: Secondary | ICD-10-CM | POA: Insufficient documentation

## 2019-07-03 DIAGNOSIS — R42 Dizziness and giddiness: Secondary | ICD-10-CM | POA: Insufficient documentation

## 2019-07-03 DIAGNOSIS — R0981 Nasal congestion: Secondary | ICD-10-CM | POA: Insufficient documentation

## 2019-07-03 DIAGNOSIS — Z79899 Other long term (current) drug therapy: Secondary | ICD-10-CM | POA: Insufficient documentation

## 2019-07-03 LAB — CBC WITH DIFFERENTIAL/PLATELET
Abs Immature Granulocytes: 0.01 10*3/uL (ref 0.00–0.07)
Basophils Absolute: 0 10*3/uL (ref 0.0–0.1)
Basophils Relative: 1 %
Eosinophils Absolute: 0.1 10*3/uL (ref 0.0–0.5)
Eosinophils Relative: 1 %
HCT: 39.8 % (ref 36.0–46.0)
Hemoglobin: 11.9 g/dL — ABNORMAL LOW (ref 12.0–15.0)
Immature Granulocytes: 0 %
Lymphocytes Relative: 42 %
Lymphs Abs: 3 10*3/uL (ref 0.7–4.0)
MCH: 27.5 pg (ref 26.0–34.0)
MCHC: 29.9 g/dL — ABNORMAL LOW (ref 30.0–36.0)
MCV: 92.1 fL (ref 80.0–100.0)
Monocytes Absolute: 0.6 10*3/uL (ref 0.1–1.0)
Monocytes Relative: 8 %
Neutro Abs: 3.3 10*3/uL (ref 1.7–7.7)
Neutrophils Relative %: 48 %
Platelets: 310 10*3/uL (ref 150–400)
RBC: 4.32 MIL/uL (ref 3.87–5.11)
RDW: 12.9 % (ref 11.5–15.5)
WBC: 7 10*3/uL (ref 4.0–10.5)
nRBC: 0 % (ref 0.0–0.2)

## 2019-07-03 LAB — COMPREHENSIVE METABOLIC PANEL
ALT: 17 U/L (ref 0–44)
AST: 21 U/L (ref 15–41)
Albumin: 4.2 g/dL (ref 3.5–5.0)
Alkaline Phosphatase: 44 U/L (ref 38–126)
Anion gap: 11 (ref 5–15)
BUN: 20 mg/dL (ref 8–23)
CO2: 29 mmol/L (ref 22–32)
Calcium: 8.9 mg/dL (ref 8.9–10.3)
Chloride: 99 mmol/L (ref 98–111)
Creatinine, Ser: 1.23 mg/dL — ABNORMAL HIGH (ref 0.44–1.00)
GFR calc Af Amer: 51 mL/min — ABNORMAL LOW (ref >60)
GFR calc non Af Amer: 44 mL/min — ABNORMAL LOW (ref >60)
Glucose, Bld: 99 mg/dL (ref 70–99)
Potassium: 3.9 mmol/L (ref 3.5–5.1)
Sodium: 139 mmol/L (ref 135–145)
Total Bilirubin: 0.5 mg/dL (ref 0.3–1.2)
Total Protein: 8 g/dL (ref 6.5–8.1)

## 2019-07-03 LAB — URINALYSIS, ROUTINE W REFLEX MICROSCOPIC
Bacteria, UA: NONE SEEN
Bilirubin Urine: NEGATIVE
Glucose, UA: NEGATIVE mg/dL
Ketones, ur: NEGATIVE mg/dL
Nitrite: NEGATIVE
Protein, ur: NEGATIVE mg/dL
Specific Gravity, Urine: 1.013 (ref 1.005–1.030)
pH: 5 (ref 5.0–8.0)

## 2019-07-03 MED ORDER — CLARITIN-D 12 HOUR 5-120 MG PO TB12: 1.0000 | ORAL_TABLET | Freq: Two times a day (BID) | ORAL | 0 refills | Status: DC

## 2019-07-03 MED ORDER — MECLIZINE HCL 12.5 MG PO TABS: 12.5000 mg | ORAL_TABLET | Freq: Three times a day (TID) | ORAL | 0 refills | Status: DC | PRN

## 2019-07-03 NOTE — ED Triage Notes (Signed)
Pt reports sinus congestion x 2 weeks.  Pt says went to the dentist Friday but was told she had a slight fever and couldn't be seen.  Today pt c/o dizziness.

## 2019-07-03 NOTE — Discharge Instructions (Addendum)
Your vital signs are nonacute.  Your neurologic examination is within normal limits.  Your urine tests and your complete blood count are nonacute at this time.  Please use Claritin-D every 12 hours for your congestion.  Please use Antivert 3 times daily as needed for dizziness.  Antivert may cause drowsiness.  Please do not drive a vehicle, operate machinery, or participate in activities requiring concentration when taking this medication.  Please change positions slowly when taking the Antivert.  Please see Dr. Legrand Rams for follow-up in the office soon.

## 2019-07-03 NOTE — ED Provider Notes (Signed)
[facility] EMERGENCY DEPARTMENT Provider Note   CSN: [id number] Arrival date & time: 07/03/19  [id number]     History   Chief Complaint Chief Complaint  Patient presents with  . Dizziness    HPI Whitney Patterson is a 73 y.o. female.     Patient is a 73 year old female who presents to the emergency department with complaint of dizziness.  The patient states that when she holds her head down and comes back up she has a sensation of being off balance.  She says if she sits still for a little while this issue will go away, but it happens almost every time she changes position with her head down.  She has not had any recent injury or trauma to the head.  No recent falls.  It is of note that she has had sinus congestion for approximately 2 weeks.  She reports no  high fever, no chills no nosebleeds.  No ear pain, and no known ear problems.  No palpitations or chest pains reported.  No complications with the shortness of breath.  Patient has not noticed any weakness of upper or lower extremities.  No difficulty with speaking or swallowing.  She presents now for assistance with this dizziness issue.   The history is provided by the patient.  Dizziness Associated symptoms: no blood in stool, no chest pain, no diarrhea, no nausea, no palpitations, no shortness of breath, no tinnitus, no vomiting and no weakness     Past Medical History:  Diagnosis Date  . Arthritis   . Constipation   . Gastroesophageal reflux disease   . Heart burn   . High cholesterol   . History of anemia   . HTN (hypertension)   . Sigmoid diverticulosis     Patient Active Problem List   Diagnosis Date Noted  . Fracture, humerus closed   . Humerus fracture 08/01/2015  . Concussion 08/01/2015  . Rotator cuff disorder 09/13/2012  . Family history of colon cancer 04/15/2012  . Chronic constipation 04/15/2012  . Low back pain 05/12/2011  . IMPINGEMENT SYNDROME 07/22/2009  . HYPERLIPIDEMIA 05/22/2009  . Essential  hypertension 05/22/2009  . GERD 05/22/2009  . DIVERTICULOSIS, SIGMOID COLON 05/22/2009  . GOUT, HX OF 05/22/2009  . ANEMIA, HX OF 05/22/2009  . ARTHRITIS, HX OF 05/22/2009    Past Surgical History:  Procedure Laterality Date  . COLONOSCOPY  09/15/2005   Few tiny diverticula at sigmoid colon, otherwise normal colonoscopy.  . ESOPHAGOGASTRODUODENOSCOPY  09/15/2005   Normal examination of esophagus. Esophagus dilated by passing 54-French  Maloney dilator given history of dysphagia but without mucosal disruption/ Nonerosive antral gastritis  . FOOT SURGERY    . NASAL SINUS SURGERY      A BULLET REMOVED FROM HER RIGHT FRONTAL SINUS 20 + YEARS AGO   . PARTIAL HYSTERECTOMY    . STOMACH SURGERY       OB History   No obstetric history on file.      Home Medications    Prior to Admission medications   Medication Sig Start Date End Date Taking? Authorizing Provider  alendronate (FOSAMAX) 70 MG tablet Take 70 mg by mouth once a week. 07/25/18   [provider]  amLODipine (NORVASC) 10 MG tablet Take 10 mg by mouth every morning.     [provider]  lisinopril (PRINIVIL,ZESTRIL) 20 MG tablet Take 20 mg by mouth 2 (two) times daily. 07/22/15   [provider]  lovastatin (MEVACOR) 20 MG tablet Take 20  mg by mouth every morning.     [provider]  omeprazole (PRILOSEC) 20 MG capsule Take 20 mg by mouth every morning.     [provider]  traMADol (ULTRAM) 50 MG tablet Take 1 tablet (50 mg total) by mouth every 6 (six) hours as needed. 03/29/19   Carole Civil, MD    Family History Family History  Problem Relation Age of Onset  . Heart disease Other   . Arthritis Other   . Cancer Other   . Diabetes Other   . Colon cancer Father 93    Social History Social History   Tobacco Use  . Smoking status: Never Smoker  . Smokeless tobacco: Never Used  Substance Use Topics  . Alcohol use: No    Comment: heavy etoh x39yrs, quit 1991  .  Drug use: No     Allergies   Codeine, Penicillins, Phenytoin, and Sulfonamide derivatives   Review of Systems Review of Systems  Constitutional: Negative for activity change and appetite change.  HENT: Positive for congestion. Negative for ear discharge, ear pain, facial swelling, nosebleeds, rhinorrhea, sneezing and tinnitus.   Eyes: Negative for photophobia, pain and discharge.  Respiratory: Negative for cough, choking, shortness of breath and wheezing.   Cardiovascular: Negative for chest pain, palpitations and leg swelling.  Gastrointestinal: Negative for abdominal pain, blood in stool, constipation, diarrhea, nausea and vomiting.  Genitourinary: Negative for difficulty urinating, dysuria, flank pain, frequency and hematuria.  Musculoskeletal: Negative for back pain, gait problem, myalgias and neck pain.  Skin: Negative for color change, rash and wound.  Neurological: Positive for dizziness. Negative for seizures, syncope, facial asymmetry, speech difficulty, weakness and numbness.  Hematological: Negative for adenopathy. Does not bruise/bleed easily.  Psychiatric/Behavioral: Negative for agitation, confusion, hallucinations, self-injury and suicidal ideas. The patient is not nervous/anxious.      Physical Exam Updated Vital Signs BP (!) 138/56 (BP Location: Right Arm)   Pulse (!) 58   Temp 97.9 F (36.6 C) (Oral)   Resp 18   Ht 5\' 1"  (1.549 m)   Wt 70.8 kg   SpO2 98%   BMI 29.48 kg/m   Physical Exam Vitals signs and nursing note reviewed.  Constitutional:      Appearance: She is well-developed. She is not toxic-appearing.  HENT:     Head: Normocephalic.     Right Ear: Tympanic membrane and external ear normal.     Left Ear: Tympanic membrane and external ear normal.  Eyes:     General: Lids are normal.     Pupils: Pupils are equal, round, and reactive to light.  Neck:     Musculoskeletal: Normal range of motion and neck supple.     Vascular: No carotid bruit.   Cardiovascular:     Rate and Rhythm: Normal rate and regular rhythm.     Pulses: Normal pulses.     Heart sounds: Normal heart sounds.  Pulmonary:     Effort: No respiratory distress.     Breath sounds: Normal breath sounds.  Abdominal:     General: Bowel sounds are normal.     Palpations: Abdomen is soft.     Tenderness: There is no abdominal tenderness. There is no guarding.  Musculoskeletal: Normal range of motion.  Lymphadenopathy:     Head:     Right side of head: No submandibular adenopathy.     Left side of head: No submandibular adenopathy.     Cervical: No cervical adenopathy.  Skin:  General: Skin is warm and dry.  Neurological:     General: No focal deficit present.     Mental Status: She is alert and oriented to person, place, and time.     Cranial Nerves: No cranial nerve deficit.     Sensory: No sensory deficit.     Motor: No weakness.     Gait: Gait normal.  Psychiatric:        Speech: Speech normal.      ED Treatments / Results  Labs (all labs ordered are listed, but only abnormal results are displayed) Labs Reviewed  CBC WITH DIFFERENTIAL/PLATELET - Abnormal; Notable for the following components:      Result Value   Hemoglobin 11.9 (*)    MCHC 29.9 (*)    All other components within normal limits  URINALYSIS, ROUTINE W REFLEX MICROSCOPIC - Abnormal; Notable for the following components:   APPearance HAZY (*)    Hgb urine dipstick SMALL (*)    Leukocytes,Ua TRACE (*)    All other components within normal limits  COMPREHENSIVE METABOLIC PANEL    EKG None  Radiology No results found.  Procedures Procedures (including critical care time)  Medications Ordered in ED Medications - No data to display   Initial Impression / Assessment and Plan / ED Course  I have reviewed the triage vital signs and the nursing notes.  Pertinent labs & imaging results that were available during my care of the patient were reviewed by me and considered in my  medical decision making (see chart for details).          Final Clinical Impressions(s) / ED Diagnoses MDM  Vital signs are within normal limits.  Pulse oximetry is 98 to 100% on room air.  Within normal limits by my interpretation.  Patient has dizziness most with change of position will or holding her head other than sitting up.  No recent injury or trauma reported.  No recent operations or procedures.  No recent changes in medication.  Patient has had upper respiratory infection with sinus congestion recently.  There are no gross neurologic deficits appreciated on examination at this time.  Patient will be treated with decongestant, as well as with Antivert.  I have discussed with the patient to use these medications with caution as the Antivert may cause drowsiness.  I discussed with her the importance of changing positions slowly and to keep yourself well-hydrated.  The patient is to follow-up with Dr. Legrand Rams in the office.  She is to return to the emergency department if any fever, chills, worsening of dizziness, palpitations, unusual headache, problems or concerns.   Final diagnoses:  Vertigo  Sinus congestion    ED Discharge Orders         Ordered    loratadine-pseudoephedrine (CLARITIN-D 12 HOUR) 5-120 MG tablet  2 times daily     07/03/19 1427    meclizine (ANTIVERT) 12.5 MG tablet  3 times daily PRN     07/03/19 1427           Lily Kocher, PA-C 07/03/19 1459    Milton Ferguson, MD 07/03/19 1505

## 2019-10-05 DIAGNOSIS — I10 Essential (primary) hypertension: Secondary | ICD-10-CM | POA: Diagnosis not present

## 2019-10-05 DIAGNOSIS — E785 Hyperlipidemia, unspecified: Secondary | ICD-10-CM | POA: Diagnosis not present

## 2019-10-05 DIAGNOSIS — M810 Age-related osteoporosis without current pathological fracture: Secondary | ICD-10-CM | POA: Diagnosis not present

## 2019-10-05 DIAGNOSIS — M170 Bilateral primary osteoarthritis of knee: Secondary | ICD-10-CM | POA: Diagnosis not present

## 2019-11-15 ENCOUNTER — Ambulatory Visit: Payer: Medicare Other | Attending: Internal Medicine

## 2019-11-15 ENCOUNTER — Other Ambulatory Visit: Payer: Self-pay

## 2019-11-15 DIAGNOSIS — Z23 Encounter for immunization: Secondary | ICD-10-CM

## 2019-11-15 NOTE — Progress Notes (Signed)
   Covid-19 Vaccination Clinic  Name:  Whitney Patterson    MRN: [id number] DOB: [birth year]  11/15/2019  [name] was observed post Covid-19 immunization for 15 minutes without incidence. She was provided with Vaccine Information Sheet and instruction to access the V-Safe system.   [name] was instructed to call 911 with any severe reactions post vaccine: [name] Kitchen Difficulty breathing  . Swelling of your face and throat  . A fast heartbeat  . A bad rash all over your body  . Dizziness and weakness    Immunizations Administered    Name Date Dose VIS Date Route   Moderna COVID-19 Vaccine 11/15/2019 10:34 AM 0.5 mL 08/29/2019 Intramuscular   Manufacturer: Moderna   Lot: [id number]   [id number]

## 2019-12-13 ENCOUNTER — Ambulatory Visit: Payer: Medicare Other | Attending: Internal Medicine

## 2019-12-13 DIAGNOSIS — Z23 Encounter for immunization: Secondary | ICD-10-CM

## 2019-12-13 NOTE — Progress Notes (Signed)
   Covid-19 Vaccination Clinic  Name:  CEANNA MATECKI    MRN: [id number] DOB: 1946-06-03  12/13/2019  Ms. Alcide was observed post Covid-19 immunization for 15 minutes without incident. She was provided with Vaccine Information Sheet and instruction to access the V-Safe system.   Ms. Flikkema was instructed to call 911 with any severe reactions post vaccine: [name] Kitchen Difficulty breathing  . Swelling of face and throat  . A fast heartbeat  . A bad rash all over body  . Dizziness and weakness   Immunizations Administered    Name Date Dose VIS Date Route   Moderna COVID-19 Vaccine 12/13/2019 10:16 AM 0.5 mL 08/29/2019 Intramuscular   Manufacturer: Moderna   Lot: [id number]   [id number]

## 2020-04-02 DIAGNOSIS — E785 Hyperlipidemia, unspecified: Secondary | ICD-10-CM | POA: Diagnosis not present

## 2020-04-02 DIAGNOSIS — Z0001 Encounter for general adult medical examination with abnormal findings: Secondary | ICD-10-CM | POA: Diagnosis not present

## 2020-04-02 DIAGNOSIS — Z1389 Encounter for screening for other disorder: Secondary | ICD-10-CM | POA: Diagnosis not present

## 2020-04-02 DIAGNOSIS — M170 Bilateral primary osteoarthritis of knee: Secondary | ICD-10-CM | POA: Diagnosis not present

## 2020-04-02 DIAGNOSIS — I10 Essential (primary) hypertension: Secondary | ICD-10-CM | POA: Diagnosis not present

## 2020-04-03 DIAGNOSIS — I10 Essential (primary) hypertension: Secondary | ICD-10-CM | POA: Diagnosis not present

## 2020-04-03 DIAGNOSIS — Z79899 Other long term (current) drug therapy: Secondary | ICD-10-CM | POA: Diagnosis not present

## 2020-04-03 DIAGNOSIS — Z0001 Encounter for general adult medical examination with abnormal findings: Secondary | ICD-10-CM | POA: Diagnosis not present

## 2020-05-07 ENCOUNTER — Other Ambulatory Visit (HOSPITAL_COMMUNITY): Payer: Self-pay | Admitting: Internal Medicine

## 2020-05-07 DIAGNOSIS — Z1231 Encounter for screening mammogram for malignant neoplasm of breast: Secondary | ICD-10-CM

## 2020-06-17 ENCOUNTER — Ambulatory Visit (HOSPITAL_COMMUNITY)
Admission: RE | Admit: 2020-06-17 | Discharge: 2020-06-17 | Disposition: A | Discharge status: Home / Self Care | Payer: Medicare Other | Source: Ambulatory Visit | Attending: Internal Medicine | Admitting: Internal Medicine

## 2020-06-17 ENCOUNTER — Other Ambulatory Visit: Payer: Self-pay

## 2020-06-17 DIAGNOSIS — Z1231 Encounter for screening mammogram for malignant neoplasm of breast: Secondary | ICD-10-CM | POA: Insufficient documentation

## 2020-06-19 DIAGNOSIS — Z23 Encounter for immunization: Secondary | ICD-10-CM | POA: Diagnosis not present

## 2020-07-10 DIAGNOSIS — H47322 Drusen of optic disc, left eye: Secondary | ICD-10-CM | POA: Diagnosis not present

## 2020-08-13 DIAGNOSIS — I10 Essential (primary) hypertension: Secondary | ICD-10-CM | POA: Diagnosis not present

## 2020-08-13 DIAGNOSIS — M810 Age-related osteoporosis without current pathological fracture: Secondary | ICD-10-CM | POA: Diagnosis not present

## 2020-08-13 DIAGNOSIS — M170 Bilateral primary osteoarthritis of knee: Secondary | ICD-10-CM | POA: Diagnosis not present

## 2020-10-09 DIAGNOSIS — I10 Essential (primary) hypertension: Secondary | ICD-10-CM | POA: Diagnosis not present

## 2020-10-09 DIAGNOSIS — M810 Age-related osteoporosis without current pathological fracture: Secondary | ICD-10-CM | POA: Diagnosis not present

## 2020-10-09 DIAGNOSIS — Z0001 Encounter for general adult medical examination with abnormal findings: Secondary | ICD-10-CM | POA: Diagnosis not present

## 2020-10-09 DIAGNOSIS — M170 Bilateral primary osteoarthritis of knee: Secondary | ICD-10-CM | POA: Diagnosis not present

## 2020-10-09 DIAGNOSIS — Z1389 Encounter for screening for other disorder: Secondary | ICD-10-CM | POA: Diagnosis not present

## 2020-10-10 DIAGNOSIS — Z79899 Other long term (current) drug therapy: Secondary | ICD-10-CM | POA: Diagnosis not present

## 2020-10-10 DIAGNOSIS — Z0001 Encounter for general adult medical examination with abnormal findings: Secondary | ICD-10-CM | POA: Diagnosis not present

## 2020-10-10 DIAGNOSIS — I10 Essential (primary) hypertension: Secondary | ICD-10-CM | POA: Diagnosis not present

## 2020-11-09 DIAGNOSIS — M170 Bilateral primary osteoarthritis of knee: Secondary | ICD-10-CM | POA: Diagnosis not present

## 2020-11-09 DIAGNOSIS — I10 Essential (primary) hypertension: Secondary | ICD-10-CM | POA: Diagnosis not present

## 2020-12-07 DIAGNOSIS — E785 Hyperlipidemia, unspecified: Secondary | ICD-10-CM | POA: Diagnosis not present

## 2020-12-07 DIAGNOSIS — I10 Essential (primary) hypertension: Secondary | ICD-10-CM | POA: Diagnosis not present

## 2021-01-20 DIAGNOSIS — M170 Bilateral primary osteoarthritis of knee: Secondary | ICD-10-CM | POA: Diagnosis not present

## 2021-01-20 DIAGNOSIS — I10 Essential (primary) hypertension: Secondary | ICD-10-CM | POA: Diagnosis not present

## 2021-02-19 DIAGNOSIS — I10 Essential (primary) hypertension: Secondary | ICD-10-CM | POA: Diagnosis not present

## 2021-02-19 DIAGNOSIS — M170 Bilateral primary osteoarthritis of knee: Secondary | ICD-10-CM | POA: Diagnosis not present

## 2021-03-22 DIAGNOSIS — M170 Bilateral primary osteoarthritis of knee: Secondary | ICD-10-CM | POA: Diagnosis not present

## 2021-03-22 DIAGNOSIS — I10 Essential (primary) hypertension: Secondary | ICD-10-CM | POA: Diagnosis not present

## 2021-04-08 DIAGNOSIS — M170 Bilateral primary osteoarthritis of knee: Secondary | ICD-10-CM | POA: Diagnosis not present

## 2021-04-08 DIAGNOSIS — I10 Essential (primary) hypertension: Secondary | ICD-10-CM | POA: Diagnosis not present

## 2021-04-08 DIAGNOSIS — M810 Age-related osteoporosis without current pathological fracture: Secondary | ICD-10-CM | POA: Diagnosis not present

## 2021-04-08 DIAGNOSIS — E785 Hyperlipidemia, unspecified: Secondary | ICD-10-CM | POA: Diagnosis not present

## 2021-05-08 ENCOUNTER — Other Ambulatory Visit (HOSPITAL_COMMUNITY): Payer: Self-pay | Admitting: Internal Medicine

## 2021-05-08 DIAGNOSIS — Z1231 Encounter for screening mammogram for malignant neoplasm of breast: Secondary | ICD-10-CM

## 2021-05-09 DIAGNOSIS — I10 Essential (primary) hypertension: Secondary | ICD-10-CM | POA: Diagnosis not present

## 2021-05-09 DIAGNOSIS — M170 Bilateral primary osteoarthritis of knee: Secondary | ICD-10-CM | POA: Diagnosis not present

## 2021-06-09 DIAGNOSIS — M170 Bilateral primary osteoarthritis of knee: Secondary | ICD-10-CM | POA: Diagnosis not present

## 2021-06-09 DIAGNOSIS — I10 Essential (primary) hypertension: Secondary | ICD-10-CM | POA: Diagnosis not present

## 2021-06-19 ENCOUNTER — Ambulatory Visit (HOSPITAL_COMMUNITY)
Admission: RE | Admit: 2021-06-19 | Discharge: 2021-06-19 | Disposition: A | Discharge status: Home / Self Care | Payer: Medicare Other | Source: Ambulatory Visit | Attending: Internal Medicine | Admitting: Internal Medicine

## 2021-06-19 ENCOUNTER — Other Ambulatory Visit: Payer: Self-pay

## 2021-06-19 DIAGNOSIS — Z1231 Encounter for screening mammogram for malignant neoplasm of breast: Secondary | ICD-10-CM | POA: Diagnosis not present

## 2021-07-09 DIAGNOSIS — I10 Essential (primary) hypertension: Secondary | ICD-10-CM | POA: Diagnosis not present

## 2021-07-09 DIAGNOSIS — M170 Bilateral primary osteoarthritis of knee: Secondary | ICD-10-CM | POA: Diagnosis not present

## 2021-07-17 DIAGNOSIS — Z23 Encounter for immunization: Secondary | ICD-10-CM | POA: Diagnosis not present

## 2021-08-09 DIAGNOSIS — I10 Essential (primary) hypertension: Secondary | ICD-10-CM | POA: Diagnosis not present

## 2021-08-09 DIAGNOSIS — M170 Bilateral primary osteoarthritis of knee: Secondary | ICD-10-CM | POA: Diagnosis not present

## 2021-09-08 DIAGNOSIS — I10 Essential (primary) hypertension: Secondary | ICD-10-CM | POA: Diagnosis not present

## 2021-09-08 DIAGNOSIS — M170 Bilateral primary osteoarthritis of knee: Secondary | ICD-10-CM | POA: Diagnosis not present

## 2021-09-30 DIAGNOSIS — I10 Essential (primary) hypertension: Secondary | ICD-10-CM | POA: Diagnosis not present

## 2021-09-30 DIAGNOSIS — M170 Bilateral primary osteoarthritis of knee: Secondary | ICD-10-CM | POA: Diagnosis not present

## 2021-09-30 DIAGNOSIS — Z0001 Encounter for general adult medical examination with abnormal findings: Secondary | ICD-10-CM | POA: Diagnosis not present

## 2021-09-30 DIAGNOSIS — M810 Age-related osteoporosis without current pathological fracture: Secondary | ICD-10-CM | POA: Diagnosis not present

## 2021-09-30 DIAGNOSIS — Z1389 Encounter for screening for other disorder: Secondary | ICD-10-CM | POA: Diagnosis not present

## 2021-10-01 DIAGNOSIS — E785 Hyperlipidemia, unspecified: Secondary | ICD-10-CM | POA: Diagnosis not present

## 2021-10-01 DIAGNOSIS — I10 Essential (primary) hypertension: Secondary | ICD-10-CM | POA: Diagnosis not present

## 2021-10-01 DIAGNOSIS — Z0001 Encounter for general adult medical examination with abnormal findings: Secondary | ICD-10-CM | POA: Diagnosis not present

## 2021-10-09 ENCOUNTER — Emergency Department (HOSPITAL_COMMUNITY): Payer: Medicare Other

## 2021-10-09 ENCOUNTER — Emergency Department (HOSPITAL_COMMUNITY)
Admission: EM | Admit: 2021-10-09 | Discharge: 2021-10-09 | Disposition: A | Discharge status: Home / Self Care | Payer: Medicare Other | Attending: Emergency Medicine | Admitting: Emergency Medicine

## 2021-10-09 ENCOUNTER — Encounter (HOSPITAL_COMMUNITY): Payer: Self-pay

## 2021-10-09 ENCOUNTER — Other Ambulatory Visit: Payer: Self-pay

## 2021-10-09 DIAGNOSIS — J111 Influenza due to unidentified influenza virus with other respiratory manifestations: Secondary | ICD-10-CM | POA: Diagnosis not present

## 2021-10-09 DIAGNOSIS — J40 Bronchitis, not specified as acute or chronic: Secondary | ICD-10-CM | POA: Diagnosis not present

## 2021-10-09 DIAGNOSIS — J209 Acute bronchitis, unspecified: Secondary | ICD-10-CM | POA: Diagnosis not present

## 2021-10-09 DIAGNOSIS — Z20822 Contact with and (suspected) exposure to covid-19: Secondary | ICD-10-CM | POA: Insufficient documentation

## 2021-10-09 DIAGNOSIS — B9689 Other specified bacterial agents as the cause of diseases classified elsewhere: Secondary | ICD-10-CM | POA: Diagnosis not present

## 2021-10-09 DIAGNOSIS — H1031 Unspecified acute conjunctivitis, right eye: Secondary | ICD-10-CM | POA: Diagnosis not present

## 2021-10-09 DIAGNOSIS — R059 Cough, unspecified: Secondary | ICD-10-CM | POA: Diagnosis not present

## 2021-10-09 LAB — CBC WITH DIFFERENTIAL/PLATELET
Abs Immature Granulocytes: 0.04 10*3/uL (ref 0.00–0.07)
Basophils Absolute: 0 10*3/uL (ref 0.0–0.1)
Basophils Relative: 0 %
Eosinophils Absolute: 0.1 10*3/uL (ref 0.0–0.5)
Eosinophils Relative: 1 %
HCT: 36.5 % (ref 36.0–46.0)
Hemoglobin: 11.4 g/dL — ABNORMAL LOW (ref 12.0–15.0)
Immature Granulocytes: 0 %
Lymphocytes Relative: 21 %
Lymphs Abs: 2 10*3/uL (ref 0.7–4.0)
MCH: 28.1 pg (ref 26.0–34.0)
MCHC: 31.2 g/dL (ref 30.0–36.0)
MCV: 90.1 fL (ref 80.0–100.0)
Monocytes Absolute: 0.8 10*3/uL (ref 0.1–1.0)
Monocytes Relative: 8 %
Neutro Abs: 6.5 10*3/uL (ref 1.7–7.7)
Neutrophils Relative %: 70 %
Platelets: 347 10*3/uL (ref 150–400)
RBC: 4.05 MIL/uL (ref 3.87–5.11)
RDW: 12.9 % (ref 11.5–15.5)
WBC: 9.3 10*3/uL (ref 4.0–10.5)
nRBC: 0 % (ref 0.0–0.2)

## 2021-10-09 LAB — RESP PANEL BY RT-PCR (FLU A&B, COVID) ARPGX2
Influenza A by PCR: NEGATIVE
Influenza B by PCR: NEGATIVE
SARS Coronavirus 2 by RT PCR: NEGATIVE

## 2021-10-09 LAB — BASIC METABOLIC PANEL
Anion gap: 9 (ref 5–15)
BUN: 12 mg/dL (ref 8–23)
CO2: 27 mmol/L (ref 22–32)
Calcium: 8.9 mg/dL (ref 8.9–10.3)
Chloride: 99 mmol/L (ref 98–111)
Creatinine, Ser: 0.93 mg/dL (ref 0.44–1.00)
GFR, Estimated: >60 mL/min (ref >60)
Glucose, Bld: 111 mg/dL — ABNORMAL HIGH (ref 70–99)
Potassium: 3.6 mmol/L (ref 3.5–5.1)
Sodium: 135 mmol/L (ref 135–145)

## 2021-10-09 LAB — URINALYSIS, ROUTINE W REFLEX MICROSCOPIC
Bacteria, UA: NONE SEEN
Bilirubin Urine: NEGATIVE
Glucose, UA: NEGATIVE mg/dL
Hgb urine dipstick: NEGATIVE
Ketones, ur: 5 mg/dL — AB
Leukocytes,Ua: NEGATIVE
Nitrite: NEGATIVE
Protein, ur: 30 mg/dL — AB
Specific Gravity, Urine: 1.029 (ref 1.005–1.030)
pH: 5 (ref 5.0–8.0)

## 2021-10-09 MED ORDER — CIPROFLOXACIN HCL 0.3 % OP SOLN: OPHTHALMIC | 0 refills | Status: DC

## 2021-10-09 MED ORDER — ACETAMINOPHEN 500 MG PO TABS
1000.0000 mg | ORAL_TABLET | Freq: Once | ORAL | Status: AC
  Administered 2021-10-09: 1000 mg via ORAL
  Filled 2021-10-09: qty 2

## 2021-10-09 MED ORDER — DOXYCYCLINE HYCLATE 100 MG PO CAPS: 100.0000 mg | ORAL_CAPSULE | Freq: Two times a day (BID) | ORAL | 0 refills | Status: DC

## 2021-10-09 MED ORDER — DOXYCYCLINE HYCLATE 100 MG PO TABS
100.0000 mg | ORAL_TABLET | Freq: Once | ORAL | Status: AC
  Administered 2021-10-09: 100 mg via ORAL
  Filled 2021-10-09: qty 1

## 2021-10-09 NOTE — ED Triage Notes (Signed)
Flu like symptoms multiple complaints, since Sunday, she got dizzy and "passed out", pt has sinus congestion and cough, pt denies SOB and chest pain

## 2021-10-09 NOTE — ED Provider Notes (Signed)
[facility] Provider Note   CSN: [id number] Arrival date & time: 10/09/21  9470     History  Chief Complaint  Patient presents with   Influenza    Whitney Patterson is a 76 y.o. female.  Pt presents to the ED today with cough and congestion.  She felt dizzy on Sunday, 1/8 and passed out.  She has not passed out since then.  She has not had a fever.  Her right eye was matted shut this morning.  She did not take any meds this am before coming in.      Home Medications Prior to Admission medications   Medication Sig Start Date End Date Taking? Authorizing Provider  ciprofloxacin (CILOXAN) 0.3 % ophthalmic solution 1 drop every 4 hours, while awake, for the next 5 days. 10/09/21  Yes Isla Pence, MD  doxycycline (VIBRAMYCIN) 100 MG capsule Take 1 capsule (100 mg total) by mouth 2 (two) times daily. 10/09/21  Yes Isla Pence, MD  alendronate (FOSAMAX) 70 MG tablet Take 70 mg by mouth once a week. 07/25/18   [provider]  amLODipine (NORVASC) 10 MG tablet Take 10 mg by mouth every morning.     [provider]  lisinopril (PRINIVIL,ZESTRIL) 20 MG tablet Take 20 mg by mouth 2 (two) times daily. 07/22/15   [provider]  loratadine-pseudoephedrine (CLARITIN-D 12 HOUR) 5-120 MG tablet Take 1 tablet by mouth 2 (two) times daily. 07/03/19   Lily Kocher, PA-C  lovastatin (MEVACOR) 20 MG tablet Take 20 mg by mouth every morning.     [provider]  meclizine (ANTIVERT) 12.5 MG tablet Take 1 tablet (12.5 mg total) by mouth 3 (three) times daily as needed for dizziness. 07/03/19   Lily Kocher, PA-C  omeprazole (PRILOSEC) 20 MG capsule Take 20 mg by mouth every morning.     [provider]  traMADol (ULTRAM) 50 MG tablet Take 1 tablet (50 mg total) by mouth every 6 (six) hours as needed. 03/29/19   Carole Civil, MD      Allergies    Codeine, Penicillins, Phenytoin, and Sulfonamide derivatives    Review of  Systems   Review of Systems  Constitutional:  Positive for fever.  Eyes:  Positive for discharge.  Respiratory:  Positive for cough and shortness of breath.   Neurological:  Positive for syncope.  All other systems reviewed and are negative.  Physical Exam Updated Vital Signs BP 129/74    Pulse 83    Temp 100.2 F (37.9 C)    Resp 20    Ht 5\' 2"  (1.575 m)    Wt 71 kg    SpO2 100%    BMI 28.63 kg/m  Physical Exam Vitals and nursing note reviewed.  Constitutional:      Appearance: Normal appearance.  HENT:     Head: Normocephalic and atraumatic.     Right Ear: External ear normal.     Left Ear: External ear normal.     Nose: Nose normal.     Mouth/Throat:     Mouth: Mucous membranes are moist.     Pharynx: Oropharynx is clear.  Eyes:     Extraocular Movements: Extraocular movements intact.     Conjunctiva/sclera:     Right eye: Right conjunctiva is injected.     Pupils: Pupils are equal, round, and reactive to light.  Cardiovascular:     Rate and Rhythm: Normal rate and regular rhythm.  Pulmonary:     Effort:  Pulmonary effort is normal.     Breath sounds: Normal breath sounds.  Abdominal:     General: Abdomen is flat. Bowel sounds are normal.     Palpations: Abdomen is soft.  Musculoskeletal:        General: Normal range of motion.  Skin:    General: Skin is warm.     Capillary Refill: Capillary refill takes less than 2 seconds.  Neurological:     General: No focal deficit present.     Mental Status: She is alert and oriented to person, place, and time.  Psychiatric:        Mood and Affect: Mood normal.        Behavior: Behavior normal.    ED Results / Procedures / Treatments   Labs (all labs ordered are listed, but only abnormal results are displayed) Labs Reviewed  BASIC METABOLIC PANEL - Abnormal; Notable for the following components:      Result Value   Glucose, Bld 111 (*)    All other components within normal limits  CBC WITH DIFFERENTIAL/PLATELET -  Abnormal; Notable for the following components:   Hemoglobin 11.4 (*)    All other components within normal limits  RESP PANEL BY RT-PCR (FLU A&B, COVID) ARPGX2  URINALYSIS, ROUTINE W REFLEX MICROSCOPIC    EKG None  Radiology DG Chest 2 View  Result Date: 10/09/2021 CLINICAL DATA:  76 year old female with cough EXAM: CHEST - 2 VIEW COMPARISON:  11/27/2010 FINDINGS: Cardiomediastinal silhouette unchanged in size and contour. No evidence of central vascular congestion. No interlobular septal thickening. No pneumothorax or pleural effusion. Coarsened interstitial markings, with no confluent airspace disease. No acute displaced fracture. Degenerative changes of the spine. IMPRESSION: No active cardiopulmonary disease. Electronically Signed   By: Corrie Mckusick D.O.   On: 10/09/2021 08:18    Procedures Procedures    Medications Ordered in ED Medications  doxycycline (VIBRA-TABS) tablet 100 mg (has no administration in time range)  acetaminophen (TYLENOL) tablet 1,000 mg (1,000 mg Oral Given 10/09/21 6433)    ED Course/ Medical Decision Making/ A&P                           Medical Decision Making  Pt's chart, labs and x-rays reviewed.  She is negative for Covid/flu.  She has had cough sx for a week, so I am going to start her on doxy.  She is also d/c with Ciloxan for her right eye.  She is instructed to return if worse.  F/u with pcp.  She feels well and is stable for d/c.        Final Clinical Impression(s) / ED Diagnoses Final diagnoses:  Bronchitis  Acute bacterial conjunctivitis of right eye    Rx / DC Orders ED Discharge Orders          Ordered    doxycycline (VIBRAMYCIN) 100 MG capsule  2 times daily        10/09/21 0941    ciprofloxacin (CILOXAN) 0.3 % ophthalmic solution        10/09/21 0941              Isla Pence, MD 10/09/21 [phone]

## 2021-10-09 NOTE — ED Notes (Signed)
Patient is attempting to provide urine specimen.

## 2021-10-31 DIAGNOSIS — I10 Essential (primary) hypertension: Secondary | ICD-10-CM | POA: Diagnosis not present

## 2021-10-31 DIAGNOSIS — M170 Bilateral primary osteoarthritis of knee: Secondary | ICD-10-CM | POA: Diagnosis not present

## 2021-12-01 DIAGNOSIS — I10 Essential (primary) hypertension: Secondary | ICD-10-CM | POA: Diagnosis not present

## 2021-12-01 DIAGNOSIS — M170 Bilateral primary osteoarthritis of knee: Secondary | ICD-10-CM | POA: Diagnosis not present

## 2022-01-01 DIAGNOSIS — I10 Essential (primary) hypertension: Secondary | ICD-10-CM | POA: Diagnosis not present

## 2022-01-01 DIAGNOSIS — M810 Age-related osteoporosis without current pathological fracture: Secondary | ICD-10-CM | POA: Diagnosis not present

## 2022-01-31 DIAGNOSIS — I10 Essential (primary) hypertension: Secondary | ICD-10-CM | POA: Diagnosis not present

## 2022-01-31 DIAGNOSIS — M170 Bilateral primary osteoarthritis of knee: Secondary | ICD-10-CM | POA: Diagnosis not present

## 2022-03-03 DIAGNOSIS — I10 Essential (primary) hypertension: Secondary | ICD-10-CM | POA: Diagnosis not present

## 2022-03-03 DIAGNOSIS — M170 Bilateral primary osteoarthritis of knee: Secondary | ICD-10-CM | POA: Diagnosis not present

## 2022-03-19 ENCOUNTER — Encounter: Payer: Self-pay | Admitting: *Deleted

## 2022-03-26 ENCOUNTER — Encounter: Payer: Self-pay | Admitting: *Deleted

## 2022-03-26 NOTE — Patient Instructions (Addendum)
Referring MD/PCP: Dr. Legrand Rams  Procedure: Colonoscopy  Has patient had this procedure before?  04/27/12, Dr. Oneida Alar  If so, when, by whom and where?    Is there a family history of colon cancer?  Yes, father  Who?  What age when diagnosed?    Is patient diabetic? If yes, Type 1 or Type 2   no      Does patient have prosthetic heart valve or mechanical valve?  no  Do you have a pacemaker/defibrillator?  no  Has patient ever had endocarditis/atrial fibrillation? no  Does patient use oxygen? no  Has patient had joint replacement within last 12 months?  no  Is patient constipated or do they take laxatives? Yes, laxatives  Does patient have a history of alcohol/drug use?  no  Have you had a stroke/heart attack last 6 mths? no  Do you take medicine for weight loss?  no  For female patients,: have you had a hysterectomy yes                      are you post menopausal yes                      do you still have your menstrual cycle no  Is patient on blood thinner such as Coumadin, Plavix and/or Aspirin? no  Medications:   Current Outpatient Medications on File Prior to Visit  Medication Sig Dispense Refill   amLODipine (NORVASC) 10 MG tablet Take 10 mg by mouth daily.     lisinopril-hydrochlorothiazide (ZESTORETIC) 20-12.5 MG tablet daily.     lovastatin (MEVACOR) 20 MG tablet Take 20 mg by mouth every morning.      omeprazole (PRILOSEC) 20 MG capsule Take 20 mg by mouth every morning.      No current facility-administered medications on file prior to visit.        Allergies:  Allergies  Allergen Reactions   Codeine     REACTION: unknown reaction   Penicillins     REACTION: unknown reaction   Phenytoin Hives   Sulfonamide Derivatives     REACTION: unknown reaction     Estimated body mass index is 28.63 kg/m as calculated from the following:   Height as of 10/09/21: '5\' 2"'$  (1.575 m).   Weight as of 10/09/21: 156 lb 8.4 oz (71 kg).

## 2022-03-26 NOTE — Progress Notes (Signed)
Recommend VV to discuss constipation to ensure we have an adequate prep. Please schedule VV on a Friday with me if she has the capability.

## 2022-03-26 NOTE — Progress Notes (Signed)
Please schedule. thanks

## 2022-03-30 ENCOUNTER — Encounter: Payer: Self-pay | Admitting: Internal Medicine

## 2022-04-20 NOTE — Progress Notes (Unsigned)
Referring Provider: Carrolyn Meiers, MD Primary Care Physician:  Carrolyn Meiers, MD Primary Gastroenterologist:  Dr. Abbey Chatters  No chief complaint on file.   HPI:   STORMEE DUDA is a 76 y.o. female presenting today to discuss scheduling colonoscopy.  Recommended office visit due to constipation.  Last colonoscopy in July 2013 with internal hemorrhoids, moderate left-sided diverticulosis, otherwise normal exam.  Recommended repeat in 10 years.  Today:     Father with colon cancer at age 85.  Past Medical History:  Diagnosis Date   Arthritis    Constipation    Gastroesophageal reflux disease    Heart burn    High cholesterol    History of anemia    HTN (hypertension)    Sigmoid diverticulosis     Past Surgical History:  Procedure Laterality Date   COLONOSCOPY  09/15/2005   Few tiny diverticula at sigmoid colon, otherwise normal colonoscopy.   ESOPHAGOGASTRODUODENOSCOPY  09/15/2005   Normal examination of esophagus. Esophagus dilated by passing 54-French  Maloney dilator given history of dysphagia but without mucosal disruption/ Nonerosive antral gastritis   FOOT SURGERY     NASAL SINUS SURGERY      A BULLET REMOVED FROM HER RIGHT FRONTAL SINUS 20 + YEARS AGO    PARTIAL HYSTERECTOMY     STOMACH SURGERY      Current Outpatient Medications  Medication Sig Dispense Refill   amLODipine (NORVASC) 10 MG tablet Take 10 mg by mouth daily.     lisinopril-hydrochlorothiazide (ZESTORETIC) 20-12.5 MG tablet daily.     lovastatin (MEVACOR) 20 MG tablet Take 20 mg by mouth every morning.      omeprazole (PRILOSEC) 20 MG capsule Take 20 mg by mouth every morning.      No current facility-administered medications for this visit.    Allergies as of 04/22/2022 - Review Complete 10/09/2021  Allergen Reaction Noted   Codeine     Penicillins     Phenytoin Hives    Sulfonamide derivatives      Family History  Problem Relation Age of Onset   Heart disease  Other    Arthritis Other    Cancer Other    Diabetes Other    Colon cancer Father 5    Social History   Socioeconomic History   Marital status: Married    Spouse name: Not on file   Number of children: 0   Years of education: 8th grade   Highest education level: Not on file  Occupational History   Occupation: maid    Employer: [employer]  Tobacco Use   Smoking status: Never   Smokeless tobacco: Never  Substance and Sexual Activity   Alcohol use: No    Comment: heavy etoh x18yr, quit 1991   Drug use: No   Sexual activity: Not on file  Other Topics Concern   Not on file  Social History Narrative   Not on file   Social Determinants of Health   Financial Resource Strain: Not on file  Food Insecurity: Not on file  Transportation Needs: Not on file  Physical Activity: Not on file  Stress: Not on file  Social Connections: Not on file  Intimate Partner Violence: Not on file    Review of Systems: Gen: Denies any fever, chills, cold or flulike symptoms, presyncope, syncope. CV: Denies chest pain, heart palpitations.  Resp: Denies shortness of breath, cough.  GI: See HPI GU : Denies urinary burning, urinary frequency, urinary hesitancy MS: Denies joint pain. Derm: Denies  rash. Psych: Denies depression, anxiety. Heme: See HPI.  Physical Exam: There were no vitals taken for this visit. General:   Alert and oriented. Pleasant and cooperative. Well-nourished and well-developed.  Head:  Normocephalic and atraumatic. Eyes:  Without icterus, sclera clear and conjunctiva pink.  Ears:  Normal auditory acuity. Lungs:  Clear to auscultation bilaterally. No wheezes, rales, or rhonchi. No distress.  Heart:  S1, S2 present without murmurs appreciated.  Abdomen:  +BS, soft, non-tender and non-distended. No HSM noted. No guarding or rebound. No masses appreciated.  Rectal:  Deferred  Msk:  Symmetrical without gross deformities. Normal posture. Extremities:  Without  edema. Neurologic:  Alert and  oriented x4;  grossly normal neurologically. Skin:  Intact without significant lesions or rashes. Psych:  Normal mood and affect.    Assessment:     Plan:  ***   Aliene Altes, PA-C [facility] 04/22/2022

## 2022-04-22 ENCOUNTER — Encounter: Payer: Self-pay | Admitting: *Deleted

## 2022-04-22 ENCOUNTER — Ambulatory Visit (INDEPENDENT_AMBULATORY_CARE_PROVIDER_SITE_OTHER): Payer: Medicare Other | Admitting: Gastroenterology

## 2022-04-22 ENCOUNTER — Encounter: Payer: Self-pay | Admitting: Gastroenterology

## 2022-04-22 VITALS — BP 125/71 | HR 63 | Temp 97.8°F | Ht 61.0 in | Wt 142.8 lb

## 2022-04-22 DIAGNOSIS — K5900 Constipation, unspecified: Secondary | ICD-10-CM

## 2022-04-22 DIAGNOSIS — M170 Bilateral primary osteoarthritis of knee: Secondary | ICD-10-CM | POA: Diagnosis not present

## 2022-04-22 DIAGNOSIS — E785 Hyperlipidemia, unspecified: Secondary | ICD-10-CM | POA: Diagnosis not present

## 2022-04-22 DIAGNOSIS — K219 Gastro-esophageal reflux disease without esophagitis: Secondary | ICD-10-CM | POA: Diagnosis not present

## 2022-04-22 DIAGNOSIS — Z1211 Encounter for screening for malignant neoplasm of colon: Secondary | ICD-10-CM

## 2022-04-22 DIAGNOSIS — I10 Essential (primary) hypertension: Secondary | ICD-10-CM | POA: Diagnosis not present

## 2022-04-22 MED ORDER — NA SULFATE-K SULFATE-MG SULF 17.5-3.13-1.6 GM/177ML PO SOLN: ORAL | 0 refills | Status: DC

## 2022-04-22 NOTE — Addendum Note (Signed)
Addended by: Cheron Every on: 04/22/2022 03:31 PM   Modules accepted: Orders

## 2022-04-22 NOTE — Patient Instructions (Signed)
We will arrange you to have a colonoscopy in the near future with Dr. Abbey Chatters.  As we discussed, it is important that we get your constipation under good control prior to your colonoscopy to ensure that you have a good prep.  I recommend that you start MiraLAX 1 capful (17 g) daily in 8 ounces of water.  Please call with a progress report in 2-3 weeks to let me know how MiraLAX is working for you.  If you are not having productive bowel movements daily to every other day, I will have additional recommendations for you.  It was very nice meeting you today!  Aliene Altes, PA-C [facility]

## 2022-04-23 ENCOUNTER — Telehealth: Payer: Self-pay | Admitting: *Deleted

## 2022-04-23 NOTE — Progress Notes (Deleted)
PA approved via [facility]. Auth# [id number], DOS: May 15, 2022 - Aug 13, 2022

## 2022-04-23 NOTE — Telephone Encounter (Signed)
PA approved via [facility]. Auth# [id number], DOS: May 15, 2022 - Aug 13, 2022

## 2022-05-04 ENCOUNTER — Other Ambulatory Visit (HOSPITAL_COMMUNITY)
Admission: RE | Admit: 2022-05-04 | Discharge: 2022-05-04 | Disposition: A | Discharge status: Home / Self Care | Payer: Medicare Other | Source: Ambulatory Visit | Attending: Internal Medicine | Admitting: Internal Medicine

## 2022-05-04 DIAGNOSIS — Z1211 Encounter for screening for malignant neoplasm of colon: Secondary | ICD-10-CM | POA: Diagnosis not present

## 2022-05-04 LAB — BASIC METABOLIC PANEL
Anion gap: 7 (ref 5–15)
BUN: 14 mg/dL (ref 8–23)
CO2: 27 mmol/L (ref 22–32)
Calcium: 8.9 mg/dL (ref 8.9–10.3)
Chloride: 104 mmol/L (ref 98–111)
Creatinine, Ser: 0.92 mg/dL (ref 0.44–1.00)
GFR, Estimated: >60 mL/min (ref >60)
Glucose, Bld: 98 mg/dL (ref 70–99)
Potassium: 4 mmol/L (ref 3.5–5.1)
Sodium: 138 mmol/L (ref 135–145)

## 2022-05-14 ENCOUNTER — Encounter (HOSPITAL_COMMUNITY): Payer: Self-pay | Admitting: Anesthesiology

## 2022-05-15 ENCOUNTER — Ambulatory Visit (HOSPITAL_COMMUNITY): Admission: RE | Admit: 2022-05-15 | Payer: Medicare Other | Source: Ambulatory Visit

## 2022-05-15 ENCOUNTER — Encounter (HOSPITAL_COMMUNITY): Admission: RE | Payer: Self-pay | Source: Ambulatory Visit

## 2022-05-15 ENCOUNTER — Telehealth: Payer: Self-pay | Admitting: *Deleted

## 2022-05-15 DIAGNOSIS — Z1211 Encounter for screening for malignant neoplasm of colon: Secondary | ICD-10-CM

## 2022-05-15 SURGERY — COLONOSCOPY WITH PROPOFOL
Anesthesia: Monitor Anesthesia Care

## 2022-05-15 NOTE — Telephone Encounter (Signed)
Needs OV to regroup and discuss further. Routing to [place] to schedule.

## 2022-05-15 NOTE — Telephone Encounter (Signed)
Patient had to cancel her colonoscopy for today because she was unable to keep the prep down. Please advise. Thank you

## 2022-05-23 DIAGNOSIS — K2100 Gastro-esophageal reflux disease with esophagitis, without bleeding: Secondary | ICD-10-CM | POA: Diagnosis not present

## 2022-05-23 DIAGNOSIS — E782 Mixed hyperlipidemia: Secondary | ICD-10-CM | POA: Diagnosis not present

## 2022-06-02 ENCOUNTER — Other Ambulatory Visit (HOSPITAL_COMMUNITY): Payer: Self-pay | Admitting: Internal Medicine

## 2022-06-02 DIAGNOSIS — Z1231 Encounter for screening mammogram for malignant neoplasm of breast: Secondary | ICD-10-CM

## 2022-06-08 NOTE — Telephone Encounter (Signed)
Pt can't do virtual visits, she is aware of OV in person

## 2022-06-08 NOTE — Telephone Encounter (Signed)
Noted  

## 2022-06-17 NOTE — H&P (View-Only) (Signed)
GI Office Note    Referring Provider: Carrolyn Meiers* Primary Care Physician:  Carrolyn Meiers, MD Primary Gastroenterologist: Dr. Abbey Chatters  Date:  06/18/2022  ID:  CHRISTAL LAGERSTROM, DOB 08-20-1946, MRN [id number]   Chief Complaint   Chief Complaint  Patient presents with   Follow-up    History of Present Illness  Nanette A Guttierrez is a 76 y.o. female with a history of HTN, HLD, chronic mild normocytic anemia, GERD, and constipation presenting today for follow up.   Colonoscopy July 2013 - internal hemorrhoids, moderate left-sided diverticulosis, otherwise normal exam. Recommended repeat in 10 years. (Tri-lyte prep)  Last office visit 04/22/22 to discuss scheduling screening colonoscopy due to constipation. Was taking phillips magnesium once a week for constipation with usually having 2 Bms per week. Denied melena, BRBPR, abdominal pain. Reflux controlled on omeprazole 20 mg daily. Advised to start miralax 17 g daily and call with progress report in 2-3 weeks. She was scheduled for colonoscopy.  Patient was unable to keep prep down and OV recommended to re group. Per chart review patient was ordered suprep for her bowel prep.   Today: Was drinking her prep and then she all of a sudden was not able to keep it down. No nausea prior to that and none since then. No abdominal pain.   Does not have regular bowel movements. Taking phillips magnesium once a week and only have a bowel movement once per week. If she east certain foods she may have an additional bowel movement. Is only taking iron pills, no extra fiber. Stools are soft but just unable to go. Reports she has tried miralax before and she would have a bowel movement the next day. Reports she takes that when she can. 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   Takes  omeprazole 20 mg every morning.    Current Outpatient Medications  Medication Sig Dispense Refill   acetaminophen (TYLENOL) 650 MG CR tablet Take 650 mg by mouth at bedtime.     alendronate (FOSAMAX) 70 MG tablet Take 70 mg by mouth once a week.     amLODipine (NORVASC) 10 MG tablet Take 10 mg by mouth daily.     calcium carbonate (CALTRATE 600) 1500 (600 Ca) MG TABS tablet Take 600 mg of elemental calcium by mouth daily with breakfast.     lisinopril-hydrochlorothiazide (ZESTORETIC) 20-12.5 MG tablet Take 1 tablet by mouth every morning.     lovastatin (MEVACOR) 20 MG tablet Take 20 mg by mouth every morning.      omeprazole (PRILOSEC) 20 MG capsule Take 20 mg by mouth every morning.      Polyethyl Glycol-Propyl Glycol (SYSTANE) 0.4-0.3 % SOLN Place 1 drop into both eyes daily as needed (Dry eye).     No current facility-administered medications for this visit.    Past Medical History:  Diagnosis Date   Arthritis    Constipation    Gastroesophageal reflux disease    Heart burn    High cholesterol    History of anemia    HTN (hypertension)    Osteoporosis    Sigmoid diverticulosis     Past Surgical History:  Procedure Laterality Date   COLONOSCOPY  09/15/2005   Few tiny diverticula at sigmoid colon, otherwise normal colonoscopy.   ESOPHAGOGASTRODUODENOSCOPY  09/15/2005   Normal examination of esophagus.  Esophagus dilated by passing 54-French  Maloney dilator given history of dysphagia but without mucosal disruption/ Nonerosive antral gastritis   FOOT SURGERY     NASAL SINUS SURGERY      A BULLET REMOVED FROM HER RIGHT FRONTAL SINUS 20 + YEARS AGO    PARTIAL HYSTERECTOMY     STOMACH SURGERY      Family History  Problem Relation Age of Onset   Heart disease Other    Arthritis Other    Cancer Other    Diabetes Other    Colon cancer Father 79    Allergies as of 06/18/2022 - Review Complete 06/18/2022  Allergen Reaction Noted   Codeine Other (See Comments)     Penicillins Swelling    Phenytoin Hives    Sulfonamide derivatives Other (See Comments)     Social History   Socioeconomic History   Marital status: Married    Spouse name: Not on file   Number of children: 0   Years of education: 8th grade   Highest education level: Not on file  Occupational History   Occupation: maid    Employer: [employer]  Tobacco Use   Smoking status: Never   Smokeless tobacco: Never  Substance and Sexual Activity   Alcohol use: No    Comment: heavy etoh x52yr, quit 1991   Drug use: No   Sexual activity: Not Currently  Other Topics Concern   Not on file  Social History Narrative   Not on file   Social Determinants of Health   Financial Resource Strain: Not on file  Food Insecurity: Not on file  Transportation Needs: Not on file  Physical Activity: Not on file  Stress: Not on file  Social Connections: Not on file     Review of Systems   Gen: Denies fever, chills, anorexia. Denies fatigue, weakness, weight loss.  CV: Denies chest pain, palpitations, syncope, peripheral edema, and claudication. Resp: Denies dyspnea at rest, cough, wheezing, coughing up blood, and pleurisy. GI: See HPI Derm: Denies rash, itching, dry skin Psych: Denies depression, anxiety, memory loss, confusion. No homicidal or suicidal ideation.  Heme: Denies bruising, bleeding, and enlarged lymph nodes.   Physical Exam   BP 135/74 (BP Location: Right Arm, Patient Position: Sitting, Cuff Size: Normal)   Pulse 63   Temp 98 F (36.7 C) (Temporal)   Ht '5\' 1"'$  (1.549 m)   Wt 144 lb 9.6 oz (65.6 kg)   SpO2 99%   BMI 27.32 kg/m   General:   Alert and oriented. No distress noted. Pleasant and cooperative.  Head:  Normocephalic and atraumatic. Eyes:  Conjuctiva clear without scleral icterus. Lungs:  Clear to auscultation bilaterally. No wheezes, rales, or rhonchi. No distress.  Heart:  S1, S2 present without murmurs appreciated.  Abdomen:  +BS, soft, non-tender and  non-distended. No rebound or guarding. No HSM or masses noted. Rectal: Deferred Msk:  Symmetrical without gross deformities. Normal posture. Extremities:  Without edema. Neurologic:  Alert and  oriented x4 Psych:  Alert and cooperative. Normal mood and affect.   Assessment  Malley A BScarfoneis a 76y.o. female with a history of HTN, HLD, chronic mild normocytic anemia, GERD, and constipation presenting today for follow up to discuss new prep for screening colonoscopy.   Screening for colon cancer/Family history of colon cancer: Last colonoscopy in July 2013 with internal hemorrhoids, otherwise normal. Father with colon cancer at age 74 Prep with Trilyte in 2013. Given Suprep for recent colonoscopy and unable to tolerate.  She reported that she drank as instructions stated, and without warning she vomited it up.  She did not have any nausea, abdominal pain, or any other symptoms associated.  She was only able to have 1 small bowel movement, therefore she called to cancel procedure.  Given that she tolerated TriLyte before in the past with her previous colonoscopy, we will reattempt colonoscopy with TriLyte.  We will also augment her prep with Linzess 72 mcg daily for 4 days prior to her procedure given her history of constipation.  Continues to deny any significant GI symptoms or any alarm symptoms.  GERD: Well-controlled on omeprazole 20 mg daily.  Constipation: Taking Philips colon health once weekly and has a BM about once weekly.  She reports her stools are usually soft in nature but does have difficulty going.  She does not have any associate abdominal pain with this.  Denies any melena or hematochezia.  She reports difficulty with taking the MiraLAX, as she typically would not have a bowel movement until the next day and given her busy schedule and makes this difficult.  I encouraged her to keep taking this on a more consistent basis to have better bowel regularity, and that she should attempt  to find the best time for him to take the medication to have a bowel movement later in the evenings when she does not have her grandkids to babysit.  PLAN   Proceed with colonoscopy with propofol by Dr. Abbey Chatters  in near future: the risks, benefits, and alternatives have been discussed with the patient in detail. The patient states understanding and desires to proceed. ASA 2 Miralax 17 g (1 capful) daily.  Continue Hardin Negus' magnesium as needed Trilyte prep BMP pre op Linzess 72 mcg daily for 4 days prior to procedure. Continue omeprazole 20 mg daily. Follow-up in 1 year or sooner if needed.   Venetia Night, MSN, APRN, FNP-BC, AGACNP-BC [facility]

## 2022-06-17 NOTE — Progress Notes (Unsigned)
GI Office Note    Referring Provider: Carrolyn Patterson* Primary Care Physician:  Whitney Meiers, MD Primary Gastroenterologist: Dr. Abbey Patterson  Date:  06/18/2022  ID:  Whitney Patterson, DOB 1946-03-22, MRN [id number]   Chief Complaint   Chief Complaint  Patient presents with   Follow-up    History of Present Illness  Whitney Patterson is a 76 y.o. female with a history of HTN, HLD, chronic mild normocytic anemia, GERD, and constipation presenting today for follow up.   Colonoscopy July 2013 - internal hemorrhoids, moderate left-sided diverticulosis, otherwise normal exam. Recommended repeat in 10 years. (Tri-lyte prep)  Last office visit 04/22/22 to discuss scheduling screening colonoscopy due to constipation. Was taking phillips magnesium once a week for constipation with usually having 2 Bms per week. Denied melena, BRBPR, abdominal pain. Reflux controlled on omeprazole 20 mg daily. Advised to start miralax 17 g daily and call with progress report in 2-3 weeks. She was scheduled for colonoscopy.  Patient was unable to keep prep down and OV recommended to re group. Per chart review patient was ordered suprep for her bowel prep.   Today: Was drinking her prep and then she all of a sudden was not able to keep it down. No nausea prior to that and none since then. No abdominal pain.   Does not have regular bowel movements. Taking phillips magnesium once a week and only have a bowel movement once per week. If she east certain foods she may have an additional bowel movement. Is only taking iron pills, no extra fiber. Stools are soft but just unable to go. Reports she has tried miralax before and she would have a bowel movement the next day. Reports she takes that when she can. 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   Takes  omeprazole 20 mg every morning.    Current Outpatient Medications  Medication Sig Dispense Refill   acetaminophen (TYLENOL) 650 MG CR tablet Take 650 mg by mouth at bedtime.     alendronate (FOSAMAX) 70 MG tablet Take 70 mg by mouth once a week.     amLODipine (NORVASC) 10 MG tablet Take 10 mg by mouth daily.     calcium carbonate (CALTRATE 600) 1500 (600 Ca) MG TABS tablet Take 600 mg of elemental calcium by mouth daily with breakfast.     lisinopril-hydrochlorothiazide (ZESTORETIC) 20-12.5 MG tablet Take 1 tablet by mouth every morning.     lovastatin (MEVACOR) 20 MG tablet Take 20 mg by mouth every morning.      omeprazole (PRILOSEC) 20 MG capsule Take 20 mg by mouth every morning.      Polyethyl Glycol-Propyl Glycol (SYSTANE) 0.4-0.3 % SOLN Place 1 drop into both eyes daily as needed (Dry eye).     No current facility-administered medications for this visit.    Past Medical History:  Diagnosis Date   Arthritis    Constipation    Gastroesophageal reflux disease    Heart burn    High cholesterol    History of anemia    HTN (hypertension)    Osteoporosis    Sigmoid diverticulosis     Past Surgical History:  Procedure Laterality Date   COLONOSCOPY  09/15/2005   Few tiny diverticula at sigmoid colon, otherwise normal colonoscopy.   ESOPHAGOGASTRODUODENOSCOPY  09/15/2005   Normal examination of esophagus.  Esophagus dilated by passing 54-French  Maloney dilator given history of dysphagia but without mucosal disruption/ Nonerosive antral gastritis   FOOT SURGERY     NASAL SINUS SURGERY      A BULLET REMOVED FROM HER RIGHT FRONTAL SINUS 20 + YEARS AGO    PARTIAL HYSTERECTOMY     STOMACH SURGERY      Family History  Problem Relation Age of Onset   Heart disease Other    Arthritis Other    Cancer Other    Diabetes Other    Colon cancer Father 7    Allergies as of 06/18/2022 - Review Complete 06/18/2022  Allergen Reaction Noted   Codeine Other (See Comments)     Penicillins Swelling    Phenytoin Hives    Sulfonamide derivatives Other (See Comments)     Social History   Socioeconomic History   Marital status: Married    Spouse name: Not on file   Number of children: 0   Years of education: 8th grade   Highest education level: Not on file  Occupational History   Occupation: maid    Employer: [employer]  Tobacco Use   Smoking status: Never   Smokeless tobacco: Never  Substance and Sexual Activity   Alcohol use: No    Comment: heavy etoh x64yr, quit 1991   Drug use: No   Sexual activity: Not Currently  Other Topics Concern   Not on file  Social History Narrative   Not on file   Social Determinants of Health   Financial Resource Strain: Not on file  Food Insecurity: Not on file  Transportation Needs: Not on file  Physical Activity: Not on file  Stress: Not on file  Social Connections: Not on file     Review of Systems   Gen: Denies fever, chills, anorexia. Denies fatigue, weakness, weight loss.  CV: Denies chest pain, palpitations, syncope, peripheral edema, and claudication. Resp: Denies dyspnea at rest, cough, wheezing, coughing up blood, and pleurisy. GI: See HPI Derm: Denies rash, itching, dry skin Psych: Denies depression, anxiety, memory loss, confusion. No homicidal or suicidal ideation.  Heme: Denies bruising, bleeding, and enlarged lymph nodes.   Physical Exam   BP 135/74 (BP Location: Right Arm, Patient Position: Sitting, Cuff Size: Normal)   Pulse 63   Temp 98 F (36.7 C) (Temporal)   Ht '5\' 1"'$  (1.549 m)   Wt 144 lb 9.6 oz (65.6 kg)   SpO2 99%   BMI 27.32 kg/m   General:   Alert and oriented. No distress noted. Pleasant and cooperative.  Head:  Normocephalic and atraumatic. Eyes:  Conjuctiva clear without scleral icterus. Lungs:  Clear to auscultation bilaterally. No wheezes, rales, or rhonchi. No distress.  Heart:  S1, S2 present without murmurs appreciated.  Abdomen:  +BS, soft, non-tender and  non-distended. No rebound or guarding. No HSM or masses noted. Rectal: Deferred Msk:  Symmetrical without gross deformities. Normal posture. Extremities:  Without edema. Neurologic:  Alert and  oriented x4 Psych:  Alert and cooperative. Normal mood and affect.   Assessment  Whitney Patterson a 76y.o. female with a history of HTN, HLD, chronic mild normocytic anemia, GERD, and constipation presenting today for follow up to discuss new prep for screening colonoscopy.   Screening for colon cancer/Family history of colon cancer: Last colonoscopy in July 2013 with internal hemorrhoids, otherwise normal. Father with colon cancer at age 74 Prep with Trilyte in 2013. Given Suprep for recent colonoscopy and unable to tolerate.  She reported that she drank as instructions stated, and without warning she vomited it up.  She did not have any nausea, abdominal pain, or any other symptoms associated.  She was only able to have 1 small bowel movement, therefore she called to cancel procedure.  Given that she tolerated TriLyte before in the past with her previous colonoscopy, we will reattempt colonoscopy with TriLyte.  We will also augment her prep with Linzess 72 mcg daily for 4 days prior to her procedure given her history of constipation.  Continues to deny any significant GI symptoms or any alarm symptoms.  GERD: Well-controlled on omeprazole 20 mg daily.  Constipation: Taking Philips colon health once weekly and has a BM about once weekly.  She reports her stools are usually soft in nature but does have difficulty going.  She does not have any associate abdominal pain with this.  Denies any melena or hematochezia.  She reports difficulty with taking the MiraLAX, as she typically would not have a bowel movement until the next day and given her busy schedule and makes this difficult.  I encouraged her to keep taking this on a more consistent basis to have better bowel regularity, and that she should attempt  to find the best time for him to take the medication to have a bowel movement later in the evenings when she does not have her grandkids to babysit.  PLAN   Proceed with colonoscopy with propofol by Dr. Abbey Patterson  in near future: the risks, benefits, and alternatives have been discussed with the patient in detail. The patient states understanding and desires to proceed. ASA 2 Miralax 17 g (1 capful) daily.  Continue Hardin Negus' magnesium as needed Trilyte prep BMP pre op Linzess 72 mcg daily for 4 days prior to procedure. Continue omeprazole 20 mg daily. Follow-up in 1 year or sooner if needed.   Venetia Night, MSN, APRN, FNP-BC, AGACNP-BC [facility]

## 2022-06-18 ENCOUNTER — Encounter: Payer: Self-pay | Admitting: *Deleted

## 2022-06-18 ENCOUNTER — Encounter: Payer: Self-pay | Admitting: Gastroenterology

## 2022-06-18 ENCOUNTER — Ambulatory Visit (INDEPENDENT_AMBULATORY_CARE_PROVIDER_SITE_OTHER): Payer: Medicare Other | Admitting: Gastroenterology

## 2022-06-18 VITALS — BP 135/74 | HR 63 | Temp 98.0°F | Ht 61.0 in | Wt 144.6 lb

## 2022-06-18 DIAGNOSIS — Z800 Family history of malignant neoplasm of digestive organs: Secondary | ICD-10-CM

## 2022-06-18 DIAGNOSIS — K219 Gastro-esophageal reflux disease without esophagitis: Secondary | ICD-10-CM | POA: Diagnosis not present

## 2022-06-18 DIAGNOSIS — Z1211 Encounter for screening for malignant neoplasm of colon: Secondary | ICD-10-CM

## 2022-06-18 DIAGNOSIS — K5900 Constipation, unspecified: Secondary | ICD-10-CM | POA: Diagnosis not present

## 2022-06-18 MED ORDER — PEG 3350-KCL-NA BICARB-NACL 420 G PO SOLR: 4000.0000 mL | Freq: Once | ORAL | 0 refills | Status: AC

## 2022-06-18 NOTE — Patient Instructions (Signed)
We will get you scheduled for a colonoscopy in the near future Dr. Abbey Chatters.  We will send in a different prep than your most recent prep.  We will try TriLyte which is a prep that you used 10 years ago.  I also am providing you samples of Linzess 72 mcg for you to take 1 capsule daily for 4 days prior to your procedure.  To help with your constipation, I recommend that you continue MiraLAX 17 g (1 capful) daily.  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  Otherwise continue taking your Hardin Negus' magnesium as needed.  We will have you follow-up in 1 year, or sooner if needed.  It was a pleasure to see you today. I want to create trusting relationships with patients. If you receive a survey regarding your visit,  I greatly appreciate you taking time to fill this out on paper or through your MyChart. I value your feedback.  Venetia Night, MSN, FNP-BC, AGACNP-BC [facility] [place] [facility]

## 2022-06-22 ENCOUNTER — Ambulatory Visit (HOSPITAL_COMMUNITY)
Admission: RE | Admit: 2022-06-22 | Discharge: 2022-06-22 | Disposition: A | Discharge status: Home / Self Care | Payer: Medicare Other | Source: Ambulatory Visit | Attending: Internal Medicine | Admitting: Internal Medicine

## 2022-06-22 DIAGNOSIS — Z1231 Encounter for screening mammogram for malignant neoplasm of breast: Secondary | ICD-10-CM | POA: Insufficient documentation

## 2022-06-23 DIAGNOSIS — I10 Essential (primary) hypertension: Secondary | ICD-10-CM | POA: Diagnosis not present

## 2022-06-23 DIAGNOSIS — E785 Hyperlipidemia, unspecified: Secondary | ICD-10-CM | POA: Diagnosis not present

## 2022-06-25 NOTE — Addendum Note (Signed)
Addended by: Sherron Monday on: 06/25/2022 08:20 AM   Modules accepted: Orders

## 2022-06-29 DIAGNOSIS — Z23 Encounter for immunization: Secondary | ICD-10-CM | POA: Diagnosis not present

## 2022-07-06 ENCOUNTER — Other Ambulatory Visit (HOSPITAL_COMMUNITY)
Admission: RE | Admit: 2022-07-06 | Discharge: 2022-07-06 | Disposition: A | Discharge status: Home / Self Care | Payer: Medicare Other | Source: Ambulatory Visit | Attending: Gastroenterology | Admitting: Gastroenterology

## 2022-07-06 DIAGNOSIS — Z1211 Encounter for screening for malignant neoplasm of colon: Secondary | ICD-10-CM | POA: Insufficient documentation

## 2022-07-06 LAB — BASIC METABOLIC PANEL
Anion gap: 9 (ref 5–15)
BUN: 16 mg/dL (ref 8–23)
CO2: 27 mmol/L (ref 22–32)
Calcium: 9.1 mg/dL (ref 8.9–10.3)
Chloride: 104 mmol/L (ref 98–111)
Creatinine, Ser: 0.96 mg/dL (ref 0.44–1.00)
GFR, Estimated: >60 mL/min (ref >60)
Glucose, Bld: 101 mg/dL — ABNORMAL HIGH (ref 70–99)
Potassium: 3.6 mmol/L (ref 3.5–5.1)
Sodium: 140 mmol/L (ref 135–145)

## 2022-07-15 ENCOUNTER — Ambulatory Visit (HOSPITAL_BASED_OUTPATIENT_CLINIC_OR_DEPARTMENT_OTHER): Payer: Medicare Other | Admitting: Certified Registered"

## 2022-07-15 ENCOUNTER — Ambulatory Visit (HOSPITAL_COMMUNITY): Payer: Medicare Other | Admitting: Certified Registered"

## 2022-07-15 ENCOUNTER — Ambulatory Visit (HOSPITAL_COMMUNITY)
Admission: RE | Admit: 2022-07-15 | Discharge: 2022-07-15 | Disposition: A | Discharge status: Home / Self Care | Payer: Medicare Other | Source: Ambulatory Visit | Attending: Internal Medicine | Admitting: Internal Medicine

## 2022-07-15 ENCOUNTER — Encounter (HOSPITAL_COMMUNITY)
Admission: RE | Disposition: A | Discharge status: Home / Self Care | Payer: Self-pay | Source: Ambulatory Visit | Attending: Internal Medicine

## 2022-07-15 ENCOUNTER — Encounter (HOSPITAL_COMMUNITY): Payer: Self-pay

## 2022-07-15 ENCOUNTER — Other Ambulatory Visit: Payer: Self-pay

## 2022-07-15 DIAGNOSIS — Z1211 Encounter for screening for malignant neoplasm of colon: Secondary | ICD-10-CM | POA: Diagnosis not present

## 2022-07-15 DIAGNOSIS — Z800 Family history of malignant neoplasm of digestive organs: Secondary | ICD-10-CM | POA: Insufficient documentation

## 2022-07-15 DIAGNOSIS — K649 Unspecified hemorrhoids: Secondary | ICD-10-CM | POA: Insufficient documentation

## 2022-07-15 DIAGNOSIS — K5900 Constipation, unspecified: Secondary | ICD-10-CM | POA: Diagnosis not present

## 2022-07-15 DIAGNOSIS — E785 Hyperlipidemia, unspecified: Secondary | ICD-10-CM | POA: Insufficient documentation

## 2022-07-15 DIAGNOSIS — D125 Benign neoplasm of sigmoid colon: Secondary | ICD-10-CM | POA: Diagnosis not present

## 2022-07-15 DIAGNOSIS — K219 Gastro-esophageal reflux disease without esophagitis: Secondary | ICD-10-CM | POA: Diagnosis not present

## 2022-07-15 DIAGNOSIS — K5790 Diverticulosis of intestine, part unspecified, without perforation or abscess without bleeding: Secondary | ICD-10-CM

## 2022-07-15 DIAGNOSIS — Z1212 Encounter for screening for malignant neoplasm of rectum: Secondary | ICD-10-CM | POA: Diagnosis not present

## 2022-07-15 DIAGNOSIS — K635 Polyp of colon: Secondary | ICD-10-CM | POA: Diagnosis not present

## 2022-07-15 DIAGNOSIS — K5730 Diverticulosis of large intestine without perforation or abscess without bleeding: Secondary | ICD-10-CM | POA: Insufficient documentation

## 2022-07-15 DIAGNOSIS — I10 Essential (primary) hypertension: Secondary | ICD-10-CM | POA: Insufficient documentation

## 2022-07-15 HISTORY — PX: POLYPECTOMY: SHX5525

## 2022-07-15 HISTORY — PX: COLONOSCOPY WITH PROPOFOL: SHX5780

## 2022-07-15 SURGERY — COLONOSCOPY WITH PROPOFOL
Anesthesia: General

## 2022-07-15 MED ORDER — PROPOFOL 500 MG/50ML IV EMUL
INTRAVENOUS | Status: DC | PRN
  Administered 2022-07-15: 170 ug/kg/min via INTRAVENOUS

## 2022-07-15 MED ORDER — PROPOFOL 10 MG/ML IV BOLUS
INTRAVENOUS | Status: DC | PRN
  Administered 2022-07-15: 40 mg via INTRAVENOUS
  Administered 2022-07-15: 60 mg via INTRAVENOUS

## 2022-07-15 MED ORDER — LIDOCAINE HCL (CARDIAC) PF 100 MG/5ML IV SOSY
PREFILLED_SYRINGE | INTRAVENOUS | Status: DC | PRN
  Administered 2022-07-15: 50 mg via INTRATRACHEAL

## 2022-07-15 MED ORDER — LACTATED RINGERS IV SOLN: INTRAVENOUS | Status: DC

## 2022-07-15 NOTE — Op Note (Signed)
[facility] Patient Name: Whitney Patterson Procedure Date: 07/15/2022 7:58 AM MRN: [id number] Date of Birth: [birth year] Attending MD: Elon Alas. Abbey Chatters DO CSN: [id number] Age: 76 Admit Type: Outpatient Procedure:                Colonoscopy Indications:              Screening for colorectal malignant neoplasm Providers:                Elon Alas. Abbey Chatters, DO, Caprice Kluver, [place], Technician Referring MD:              Medicines:                See the Anesthesia note for documentation of the                            administered medications Complications:            No immediate complications. Estimated Blood Loss:     Estimated blood loss was minimal. Procedure:                Pre-Anesthesia Assessment:                           - The anesthesia plan was to use monitored                            anesthesia care (MAC).                           After obtaining informed consent, the colonoscope                            was passed under direct vision. Throughout the                            procedure, the patient's blood pressure, pulse, and                            oxygen saturations were monitored continuously. The                            [id number]-HQ190L ([id number]) scope was introduced through                            the anus and advanced to the the terminal ileum,                            with identification of the appendiceal orifice and                            IC valve. The colonoscopy was performed without                            difficulty. The patient tolerated the  procedure                            well. The quality of the bowel preparation was                            evaluated using the BBPS [facility] Bowel Preparation                            Scale) with scores of: Right Colon = 3, Transverse                            Colon = 3 and Left Colon = 3 (entire mucosa seen                            well with no  residual staining, small fragments of                            stool or opaque liquid). The total BBPS score                            equals 9. Scope In: 8:14:21 AM Scope Out: 8:28:27 AM Scope Withdrawal Time: 0 hours 9 minutes 50 seconds  Total Procedure Duration: 0 hours 14 minutes 6 seconds  Findings:      Hemorrhoids were found on perianal exam.      Multiple small and large-mouthed diverticula were found in the sigmoid       colon.      A 4 mm polyp was found in the sigmoid colon. The polyp was sessile. The       polyp was removed with a cold snare. Resection and retrieval were       complete. Impression:               - Hemorrhoids found on perianal exam.                           - Diverticulosis in the sigmoid colon.                           - One 4 mm polyp in the sigmoid colon, removed with                            a cold snare. Resected and retrieved. Moderate Sedation:      Per Anesthesia Care Recommendation:           - Patient has a contact number available for                            emergencies. The signs and symptoms of potential                            delayed complications were discussed with the                            patient. Return to normal  activities tomorrow.                            Written discharge instructions were provided to the                            patient.                           - Resume previous diet.                           - Continue present medications.                           - Await pathology results.                           - No repeat colonoscopy due to age.                           - Return to GI clinic in 1 year. Procedure Code(s):        --- Professional ---                           [phone], Colonoscopy, flexible; with removal of                            tumor(s), polyp(s), or other lesion(s) by snare                            technique Diagnosis Code(s):        --- Professional ---                            Z12.11, Encounter for screening for malignant                            neoplasm of colon                           K64.9, Unspecified hemorrhoids                           K63.5, Polyp of colon                           K57.30, Diverticulosis of large intestine without                            perforation or abscess without bleeding CPT copyright 2019 American Medical Association. All rights reserved. The codes documented in this report are preliminary and upon coder review may  be revised to meet current compliance requirements. Elon Alas. Abbey Chatters, DO [employer] Dick Hark, DO 07/15/2022 8:33:10 AM This report has been signed electronically. Number of Addenda: 0

## 2022-07-15 NOTE — Discharge Instructions (Addendum)
  Colonoscopy Discharge Instructions  Read the instructions outlined below and refer to this sheet in the next few weeks. These discharge instructions provide you with general information on caring for yourself after you leave the hospital. Your doctor may also give you specific instructions. While your treatment has been planned according to the most current medical practices available, unavoidable complications occasionally occur.   ACTIVITY You may resume your regular activity, but move at a slower pace for the next 24 hours.  Take frequent rest periods for the next 24 hours.  Walking will help get rid of the air and reduce the bloated feeling in your belly (abdomen).  No driving for 24 hours (because of the medicine (anesthesia) used during the test).   Do not sign any important legal documents or operate any machinery for 24 hours (because of the anesthesia used during the test).  NUTRITION Drink plenty of fluids.  You may resume your normal diet as instructed by your doctor.  Begin with a light meal and progress to your normal diet. Heavy or fried foods are harder to digest and may make you feel sick to your stomach (nauseated).  Avoid alcoholic beverages for 24 hours or as instructed.  MEDICATIONS You may resume your normal medications unless your doctor tells you otherwise.  WHAT YOU CAN EXPECT TODAY Some feelings of bloating in the abdomen.  Passage of more gas than usual.  Spotting of blood in your stool or on the toilet paper.  IF YOU HAD POLYPS REMOVED DURING THE COLONOSCOPY: No aspirin products for 7 days or as instructed.  No alcohol for 7 days or as instructed.  Eat a soft diet for the next 24 hours.  FINDING OUT THE RESULTS OF YOUR TEST Not all test results are available during your visit. If your test results are not back during the visit, make an appointment with your caregiver to find out the results. Do not assume everything is normal if you have not heard from your  caregiver or the medical facility. It is important for you to follow up on all of your test results.  SEEK IMMEDIATE MEDICAL ATTENTION IF: You have more than a spotting of blood in your stool.  Your belly is swollen (abdominal distention).  You are nauseated or vomiting.  You have a temperature over 101.  You have abdominal pain or discomfort that is severe or gets worse throughout the day.   Your colonoscopy revealed 1 polyp which I removed successfully.  Given your age, I do not think we need to perform further colonoscopies for colon cancer screening purposes.  Await pathology results, my office will contact you.  You also have diverticulosis and internal hemorrhoids. I would recommend increasing fiber in your diet or adding OTC Benefiber/Metamucil. Be sure to drink at least 4 to 6 glasses of water daily.   Follow-up with GI in 1 year   I hope you have a great rest of your week!  Elon Alas. Abbey Chatters, D.O. Gastroenterology and Hepatology [facility]

## 2022-07-15 NOTE — Anesthesia Preprocedure Evaluation (Addendum)
Anesthesia Evaluation  Patient identified by MRN, date of birth, ID band Patient awake    Reviewed: Allergy & Precautions, H&P , NPO status , Patient's Chart, lab work & pertinent test results, reviewed documented beta blocker date and time   Airway Mallampati: II  TM Distance: >3 FB Neck ROM: Full    Dental no notable dental hx. (+) Lower Dentures, Upper Dentures   Pulmonary neg pulmonary ROS,    Pulmonary exam normal breath sounds clear to auscultation       Cardiovascular Exercise Tolerance: Good hypertension, negative cardio ROS   Rhythm:Regular Rate:Normal     Neuro/Psych negative neurological ROS  negative psych ROS   GI/Hepatic negative GI ROS, Neg liver ROS, GERD  ,  Endo/Other  negative endocrine ROS  Renal/GU negative Renal ROS  negative genitourinary   Musculoskeletal  (+) Arthritis ,   Abdominal   Peds  Hematology negative hematology ROS (+)   Anesthesia Other Findings   Reproductive/Obstetrics negative OB ROS                           Anesthesia Physical Anesthesia Plan  ASA: 2  Anesthesia Plan: General   Post-op Pain Management:    Induction:   PONV Risk Score and Plan: Propofol infusion  Airway Management Planned:   Additional Equipment:   Intra-op Plan:   Post-operative Plan:   Informed Consent: I have reviewed the patients History and Physical, chart, labs and discussed the procedure including the risks, benefits and alternatives for the proposed anesthesia with the patient or authorized representative who has indicated his/her understanding and acceptance.     Dental Advisory Given  Plan Discussed with: CRNA  Anesthesia Plan Comments:         Anesthesia Quick Evaluation

## 2022-07-15 NOTE — Interval H&P Note (Signed)
History and Physical Interval Note:  07/15/2022 8:03 AM  Whitney Patterson  has presented today for surgery, with the diagnosis of family CC:[id number] cancer,screening colon cancer,constipation.  The various methods of treatment have been discussed with the patient and family. After consideration of risks, benefits and other options for treatment, the patient has consented to  Procedure(s) with comments: COLONOSCOPY WITH PROPOFOL (N/A) - 8:30 am as a surgical intervention.  The patient's history has been reviewed, patient examined, no change in status, stable for surgery.  I have reviewed the patient's chart and labs.  Questions were answered to the patient's satisfaction.     Eloise Harman

## 2022-07-15 NOTE — Transfer of Care (Signed)
Immediate Anesthesia Transfer of Care Note  Patient: Whitney Patterson  Procedure(s) Performed: COLONOSCOPY WITH PROPOFOL POLYPECTOMY  Patient Location: Endoscopy Unit  Anesthesia Type:General  Level of Consciousness: sedated and patient cooperative  Airway & Oxygen Therapy: Patient Spontanous Breathing and Patient connected to nasal cannula oxygen  Post-op Assessment: Report given to RN, Post -op Vital signs reviewed and stable and Patient moving all extremities  Post vital signs: Reviewed and stable  Last Vitals:  Vitals Value Taken Time  BP    Temp    Pulse    Resp    SpO2      Last Pain:  Vitals:   07/15/22 0808  TempSrc:   PainSc: 0-No pain      Patients Stated Pain Goal: 8 (68/61/68 3729)  Complications: No notable events documented.

## 2022-07-16 LAB — SURGICAL PATHOLOGY

## 2022-07-16 NOTE — Anesthesia Postprocedure Evaluation (Signed)
Anesthesia Post Note  Patient: Whitney Patterson  Procedure(s) Performed: COLONOSCOPY WITH PROPOFOL POLYPECTOMY  Patient location during evaluation: Phase II Anesthesia Type: General Level of consciousness: awake Pain management: pain level controlled Vital Signs Assessment: post-procedure vital signs reviewed and stable Respiratory status: spontaneous breathing and respiratory function stable Cardiovascular status: blood pressure returned to baseline and stable Postop Assessment: no headache and no apparent nausea or vomiting Anesthetic complications: no Comments: Late entry   No notable events documented.   Last Vitals:  Vitals:   07/15/22 0833 07/15/22 0835  BP: (!) 82/50 (!) 93/41  Pulse: 65 66  Resp: 11 12  Temp: (!) 36.4 C   SpO2: 100% 100%    Last Pain:  Vitals:   07/15/22 0833  TempSrc: Oral  PainSc: 0-No pain                 Louann Sjogren

## 2022-07-20 ENCOUNTER — Encounter (HOSPITAL_COMMUNITY): Payer: Self-pay | Admitting: Internal Medicine

## 2022-07-23 DIAGNOSIS — E785 Hyperlipidemia, unspecified: Secondary | ICD-10-CM | POA: Diagnosis not present

## 2022-07-23 DIAGNOSIS — I10 Essential (primary) hypertension: Secondary | ICD-10-CM | POA: Diagnosis not present

## 2022-08-23 DIAGNOSIS — I10 Essential (primary) hypertension: Secondary | ICD-10-CM | POA: Diagnosis not present

## 2022-08-23 DIAGNOSIS — E785 Hyperlipidemia, unspecified: Secondary | ICD-10-CM | POA: Diagnosis not present

## 2022-09-22 DIAGNOSIS — E785 Hyperlipidemia, unspecified: Secondary | ICD-10-CM | POA: Diagnosis not present

## 2022-09-22 DIAGNOSIS — I10 Essential (primary) hypertension: Secondary | ICD-10-CM | POA: Diagnosis not present

## 2022-10-14 DIAGNOSIS — K219 Gastro-esophageal reflux disease without esophagitis: Secondary | ICD-10-CM | POA: Diagnosis not present

## 2022-10-14 DIAGNOSIS — I10 Essential (primary) hypertension: Secondary | ICD-10-CM | POA: Diagnosis not present

## 2022-10-14 DIAGNOSIS — Z1389 Encounter for screening for other disorder: Secondary | ICD-10-CM | POA: Diagnosis not present

## 2022-10-14 DIAGNOSIS — M810 Age-related osteoporosis without current pathological fracture: Secondary | ICD-10-CM | POA: Diagnosis not present

## 2022-10-14 DIAGNOSIS — M170 Bilateral primary osteoarthritis of knee: Secondary | ICD-10-CM | POA: Diagnosis not present

## 2022-10-14 DIAGNOSIS — Z0001 Encounter for general adult medical examination with abnormal findings: Secondary | ICD-10-CM | POA: Diagnosis not present

## 2022-10-14 DIAGNOSIS — E785 Hyperlipidemia, unspecified: Secondary | ICD-10-CM | POA: Diagnosis not present

## 2022-11-14 DIAGNOSIS — E785 Hyperlipidemia, unspecified: Secondary | ICD-10-CM | POA: Diagnosis not present

## 2022-11-14 DIAGNOSIS — I10 Essential (primary) hypertension: Secondary | ICD-10-CM | POA: Diagnosis not present

## 2022-12-13 DIAGNOSIS — E785 Hyperlipidemia, unspecified: Secondary | ICD-10-CM | POA: Diagnosis not present

## 2022-12-13 DIAGNOSIS — I10 Essential (primary) hypertension: Secondary | ICD-10-CM | POA: Diagnosis not present

## 2023-01-13 DIAGNOSIS — I10 Essential (primary) hypertension: Secondary | ICD-10-CM | POA: Diagnosis not present

## 2023-01-13 DIAGNOSIS — E785 Hyperlipidemia, unspecified: Secondary | ICD-10-CM | POA: Diagnosis not present

## 2023-02-12 DIAGNOSIS — E785 Hyperlipidemia, unspecified: Secondary | ICD-10-CM | POA: Diagnosis not present

## 2023-02-12 DIAGNOSIS — I10 Essential (primary) hypertension: Secondary | ICD-10-CM | POA: Diagnosis not present

## 2023-03-01 ENCOUNTER — Other Ambulatory Visit (INDEPENDENT_AMBULATORY_CARE_PROVIDER_SITE_OTHER): Payer: Medicare Other

## 2023-03-01 ENCOUNTER — Encounter: Payer: Self-pay | Admitting: Orthopedic Surgery

## 2023-03-01 ENCOUNTER — Ambulatory Visit: Payer: Medicare Other | Admitting: Orthopedic Surgery

## 2023-03-01 VITALS — BP 118/57 | HR 69 | Ht 61.0 in | Wt 146.0 lb

## 2023-03-01 DIAGNOSIS — M25552 Pain in left hip: Secondary | ICD-10-CM

## 2023-03-01 NOTE — Patient Instructions (Signed)
You have strained the muscle in your leg  No surgery is required  I recommend the following:  Heating pad 2 times a day for 30 minutes Modify your activities to limit any excessive walking Take over-the-counter Tylenol or ibuprofen as needed

## 2023-03-01 NOTE — Progress Notes (Signed)
Chief Complaint  Patient presents with   Knee Pain    Pain in left groin and radiating down left leg. Has had pain since last week    77 year old female says she jumped out of her car because it overheated about 2 weeks ago and then 1 week later started having pain in her left groin radiates down to her left knee  Exam  She is ambulatory no assistive devices may have a little bit of a limp on the left side  She is tender in the groin area and the abductors as well as some of the hip flexors  The Stenson test is normal Logroll test is normal  Pain with passive abduction  X-ray mild OA femoral head normal no real osteophytes just some mild narrowing  Assessment and plan  Strain adductors most likely Recommend symptomatic care Heat Activity modification Tylenol/ibuprofen as needed

## 2023-03-12 ENCOUNTER — Emergency Department (HOSPITAL_COMMUNITY)
Admission: EM | Admit: 2023-03-12 | Discharge: 2023-03-12 | Disposition: A | Discharge status: Home / Self Care | Payer: Medicare Other | Attending: Emergency Medicine | Admitting: Emergency Medicine

## 2023-03-12 ENCOUNTER — Encounter (HOSPITAL_COMMUNITY): Payer: Self-pay

## 2023-03-12 ENCOUNTER — Other Ambulatory Visit: Payer: Self-pay

## 2023-03-12 ENCOUNTER — Emergency Department (HOSPITAL_COMMUNITY): Payer: Medicare Other

## 2023-03-12 DIAGNOSIS — M778 Other enthesopathies, not elsewhere classified: Secondary | ICD-10-CM | POA: Insufficient documentation

## 2023-03-12 DIAGNOSIS — M25551 Pain in right hip: Secondary | ICD-10-CM | POA: Diagnosis not present

## 2023-03-12 DIAGNOSIS — M779 Enthesopathy, unspecified: Secondary | ICD-10-CM | POA: Diagnosis not present

## 2023-03-12 DIAGNOSIS — M25512 Pain in left shoulder: Secondary | ICD-10-CM | POA: Insufficient documentation

## 2023-03-12 DIAGNOSIS — M25552 Pain in left hip: Secondary | ICD-10-CM

## 2023-03-12 DIAGNOSIS — M25511 Pain in right shoulder: Secondary | ICD-10-CM | POA: Diagnosis not present

## 2023-03-12 DIAGNOSIS — M542 Cervicalgia: Secondary | ICD-10-CM | POA: Diagnosis not present

## 2023-03-12 DIAGNOSIS — M62838 Other muscle spasm: Secondary | ICD-10-CM | POA: Diagnosis not present

## 2023-03-12 MED ORDER — ACETAMINOPHEN 500 MG PO TABS: 500.0000 mg | ORAL_TABLET | Freq: Four times a day (QID) | ORAL | 0 refills | Status: AC | PRN

## 2023-03-12 MED ORDER — NAPROXEN 375 MG PO TABS: 375.0000 mg | ORAL_TABLET | Freq: Two times a day (BID) | ORAL | 0 refills | Status: DC

## 2023-03-12 MED ORDER — ACETAMINOPHEN 325 MG PO TABS
650.0000 mg | ORAL_TABLET | Freq: Once | ORAL | Status: AC
  Administered 2023-03-12: 650 mg via ORAL
  Filled 2023-03-12: qty 2

## 2023-03-12 MED ORDER — NAPROXEN 250 MG PO TABS
375.0000 mg | ORAL_TABLET | Freq: Once | ORAL | Status: AC
  Administered 2023-03-12: 375 mg via ORAL
  Filled 2023-03-12: qty 2

## 2023-03-12 MED ORDER — TIZANIDINE HCL 4 MG PO TABS
4.0000 mg | ORAL_TABLET | Freq: Once | ORAL | Status: AC
  Administered 2023-03-12: 4 mg via ORAL
  Filled 2023-03-12: qty 1

## 2023-03-12 NOTE — ED Provider Notes (Signed)
Whitney Patterson   CSN: [id number] Arrival date & time: 03/12/23  4098     History  Chief Complaint  Patient presents with   Torticollis    Body pain    Whitney Patterson is a 77 y.o. female.  HPI    77 year old female comes in with chief complaint of right shoulder pain, neck pain and left hip pain.  Patient states that over the last 3 weeks or so she has been having pain in her left hip that radiates towards the knee.  The pain is worse with any walking.  Pain is resolved when she is laying flat.  She has no associated back pain.  Additionally she is complaining of right-sided shoulder pain.  She is able to abduct and forward flex up to a certain degree, but then she will start having discomfort.  She feels tightness over the right shoulder region and there is associated pain.  She denies any focal weakness over the right upper extremity.  Patient also complains of neck pain that often will shoot towards the back of her head.  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  She has tried topical ointments without any relief.  Home Medications Prior to Admission medications   Medication Sig Start Date End Date Taking? Authorizing Provider  acetaminophen (TYLENOL) 500 MG tablet Take 1 tablet (500 mg total) by mouth every 6 (six) hours as needed. 03/12/23  Yes Derwood Kaplan, MD  naproxen (NAPROSYN) 375 MG tablet Take 1 tablet (375 mg total) by mouth 2 (two) times daily. 03/12/23  Yes Derwood Kaplan, MD  alendronate (FOSAMAX) 70 MG tablet Take 70 mg by mouth every Monday. In the morning 04/20/22   [provider]  amLODipine (NORVASC) 10 MG tablet Take 10 mg by mouth daily.    [provider]  Calcium Carb-Cholecalciferol (CALCIUM 600+D3 PO) Take 1 tablet by mouth in the morning.    [provider]  lisinopril-hydrochlorothiazide (ZESTORETIC) 20-12.5 MG tablet Take 1 tablet  by mouth every morning. 01/07/22   [provider]  lovastatin (MEVACOR) 20 MG tablet Take 20 mg by mouth every morning.     [provider]  omeprazole (PRILOSEC) 20 MG capsule Take 20 mg by mouth every morning.     [provider]  Polyethyl Glycol-Propyl Glycol (SYSTANE) 0.4-0.3 % SOLN Place 1 drop into both eyes in the morning and at bedtime. Patient not taking: Reported on 03/01/2023    [provider]      Allergies    Codeine, Penicillins, Phenytoin, and Sulfonamide derivatives    Review of Systems   Review of Systems  All other systems reviewed and are negative.   Physical Exam Updated Vital Signs Temp 98.2 F (36.8 C) (Oral)   Ht 5\' 1"  (1.549 m)   Wt 66.2 kg   BMI 27.59 kg/m  Physical Exam Vitals and nursing Patterson reviewed.  Constitutional:      Appearance: She is well-developed.  HENT:     Head: Atraumatic.  Cardiovascular:     Rate and Rhythm: Normal rate.  Pulmonary:     Effort: Pulmonary effort is normal.  Musculoskeletal:        General: Tenderness present. No swelling or deformity.     Cervical back: Neck supple.     Comments: Patient has nodules over the base of the skull and paraspinal region.  Tenderness with manipulation of those  nodules noted.  Patient also has tenderness with palpation of the [facility] joint of the right shoulder and she has discomfort with abduction at the level of shoulder plane and with forward flexion at the level of shoulder plane.  She is able to tolerate passive range of motion of the right shoulder.  Additionally patient has positive active leg raise test.  She has no focal tenderness over the lumbar spine.  Skin:    General: Skin is warm and dry.  Neurological:     Mental Status: She is alert and oriented to person, place, and time.     ED Results / Procedures / Treatments   Labs (all labs ordered are listed, but only abnormal results are displayed) Labs Reviewed - No data to  display  EKG None  Radiology DG Shoulder Right  Result Date: 03/12/2023 CLINICAL DATA:  Right shoulder pain EXAM: RIGHT SHOULDER - 2+ VIEW COMPARISON:  03/15/2019 FINDINGS: There is no evidence of fracture or dislocation. Moderate osteoarthritis of the [facility] joint and mild glenohumeral arthropathy, not significantly progressed since 2020. Soft tissues are unremarkable. 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 No acute findings. Electronically Signed   By: Duanne Guess D.O.   On: 03/12/2023 09:54   DG Hip Unilat W or Wo Pelvis 2-3 Views Left  Result Date: 03/12/2023 CLINICAL DATA:  Hip pain EXAM: DG HIP (WITH OR WITHOUT PELVIS) 2-3V LEFT COMPARISON:  None Available. FINDINGS: Hips are located. No evidence of pelvic fracture or sacral fracture. Dedicated view of the LEFT hip demonstrates no femoral neck fracture. IMPRESSION: No fracture or dislocation. Electronically Signed   By: Genevive Bi M.D.   On: 03/12/2023 09:27   DG Cervical Spine Complete  Result Date: 03/12/2023 CLINICAL DATA:  R shoulder pain, neck pain EXAM: CERVICAL SPINE - COMPLETE 4 VIEW COMPARISON:  None Available. FINDINGS: Straightening of the normal cervical lordosis. Vertebral body heights are preserved. Disc space loss in the lower cervical spine, most notably at C5-C6 and C6-C7 mild static retrolisthesis of C6 on C7. Mild radial remodeling of the cervical vertebral bodies. Visualized lung apices are clear. Patient is nearly edentulous. IMPRESSION: No acute abnormality. 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 Electronically Signed   By: Lorenza Cambridge M.D.   On: 03/12/2023 09:21    Procedures Procedures    Medications Ordered in ED Medications  naproxen (NAPROSYN) tablet 375 mg (375 mg Oral Given 03/12/23 0834)  tiZANidine (ZANAFLEX) tablet 4 mg (4 mg Oral Given 03/12/23 0834)   acetaminophen (TYLENOL) tablet 650 mg (650 mg Oral Given 03/12/23 9147)    ED Course/ Medical Decision Making/ A&P                             Medical Decision Making Amount and/or Complexity of Data Reviewed Radiology: ordered.  Risk OTC drugs. Prescription drug management.   77 year old patient comes in with chief complaint of shoulder pain, hip pain and neck pain.  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  Differential diagnosis considered for this patient includes cervical headaches, cervical spasms, rotator cuff tendinitis, shoulder arthritis and hip arthritis.  Patient has no back pain, therefore lumbar radiculopathy deemed less likely.  We will get x-ray of the right shoulder, L hip and cervical spine.  10:10 AM I have independently interpreted patient's x-ray of the right shoulder, x-ray  of the hip.  X-ray of the shoulder clearly does not show evidence of arthritic changes.  Hip x-ray overall reassuring.  I have reviewed the results of x-ray of the neck.  There is concerns for neck arthritis as well.  The patient appears reasonably screened and/or stabilized for discharge and I doubt any other medical condition or other [facility] requiring further screening, evaluation, or treatment in the ED at this time prior to discharge.   Results from the ER workup discussed with the patient face to face and all questions answered to the best of my ability. The patient is safe for discharge with strict return precautions.   Final Clinical Impression(s) / ED Diagnoses Final diagnoses:  Shoulder tendinitis, right  Spasm of cervical paraspinous muscle  Acute hip pain, left    Rx / DC Orders ED Discharge Orders          Ordered    naproxen (NAPROSYN) 375 MG tablet  2 times daily        03/12/23 1009    acetaminophen (TYLENOL) 500 MG tablet  Every 6 hours PRN        03/12/23 1009              Derwood Kaplan, MD 03/12/23 1010

## 2023-03-12 NOTE — ED Triage Notes (Signed)
Pt comes form home c/o right next stiffness and right arm along with left hip pain for 3 weeks +

## 2023-03-12 NOTE — Discharge Instructions (Addendum)
You are seen in the ER for shoulder pain, neck pain and hip pain.  The workup in the emergency room reveals that you have arthritis of your neck and right shoulder.  Hip x-ray overall looks reassuring.  As discussed, continue with the stretching exercises.  Ice to the right shoulder will be very important to reduce inflammation.  Take the medications that are prescribed.  If you are not getting better over the next 2 weeks, please follow-up with the orthopedic doctor.

## 2023-03-15 DIAGNOSIS — E785 Hyperlipidemia, unspecified: Secondary | ICD-10-CM | POA: Diagnosis not present

## 2023-03-15 DIAGNOSIS — I10 Essential (primary) hypertension: Secondary | ICD-10-CM | POA: Diagnosis not present

## 2023-03-17 ENCOUNTER — Telehealth: Payer: Self-pay | Admitting: *Deleted

## 2023-03-17 NOTE — Telephone Encounter (Signed)
Transition Care Management Unsuccessful Follow-up Telephone Call  Date of discharge and from where[name]  Attempts:  1st Attempt  Reason for unsuccessful TCM follow-up call:  Left voice message

## 2023-03-18 ENCOUNTER — Telehealth: Payer: Self-pay | Admitting: *Deleted

## 2023-03-18 NOTE — Telephone Encounter (Signed)
Transition Care Management Follow-up Telephone Call Date of discharge and from where: [facility] 03/12/2023 How have you been since you were released from the hospital? Feeling good  Any questions or concerns? No  Items Reviewed: Did the pt receive and understand the discharge instructions provided? Yes  Medications obtained and verified? Yes  Other? No  Any new allergies since your discharge? No  Dietary orders reviewed? No Do you have support at home? Yes    Follow up appointments reviewed:  PCP Hospital f/u appt confirmed? No patient will see when needed Are transportation arrangements needed? No  If their condition worsens, is the pt aware to call PCP or go to the Emergency Dept.? Yes Was the patient provided with contact information for the PCP's office or ED? Yes Was to pt encouraged to call back with questions or concerns? Yes

## 2023-04-03 ENCOUNTER — Emergency Department (HOSPITAL_COMMUNITY): Payer: Medicare Other

## 2023-04-03 ENCOUNTER — Emergency Department (HOSPITAL_COMMUNITY)
Admission: EM | Admit: 2023-04-03 | Discharge: 2023-04-03 | Disposition: A | Discharge status: Home / Self Care | Payer: Medicare Other | Attending: Emergency Medicine | Admitting: Emergency Medicine

## 2023-04-03 ENCOUNTER — Encounter (HOSPITAL_COMMUNITY): Payer: Self-pay

## 2023-04-03 ENCOUNTER — Other Ambulatory Visit: Payer: Self-pay

## 2023-04-03 DIAGNOSIS — R55 Syncope and collapse: Secondary | ICD-10-CM | POA: Insufficient documentation

## 2023-04-03 DIAGNOSIS — M25569 Pain in unspecified knee: Secondary | ICD-10-CM | POA: Diagnosis not present

## 2023-04-03 DIAGNOSIS — E860 Dehydration: Secondary | ICD-10-CM | POA: Diagnosis not present

## 2023-04-03 DIAGNOSIS — I700 Atherosclerosis of aorta: Secondary | ICD-10-CM | POA: Diagnosis not present

## 2023-04-03 DIAGNOSIS — R6884 Jaw pain: Secondary | ICD-10-CM | POA: Insufficient documentation

## 2023-04-03 DIAGNOSIS — R200 Anesthesia of skin: Secondary | ICD-10-CM | POA: Diagnosis not present

## 2023-04-03 DIAGNOSIS — W01198A Fall on same level from slipping, tripping and stumbling with subsequent striking against other object, initial encounter: Secondary | ICD-10-CM | POA: Insufficient documentation

## 2023-04-03 LAB — URINALYSIS, W/ REFLEX TO CULTURE (INFECTION SUSPECTED)
Bacteria, UA: NONE SEEN
Bilirubin Urine: NEGATIVE
Glucose, UA: NEGATIVE mg/dL
Hgb urine dipstick: NEGATIVE
Ketones, ur: 5 mg/dL — AB
Leukocytes,Ua: NEGATIVE
Nitrite: NEGATIVE
Protein, ur: NEGATIVE mg/dL
Specific Gravity, Urine: 1.01 (ref 1.005–1.030)
pH: 7 (ref 5.0–8.0)

## 2023-04-03 LAB — CBC WITH DIFFERENTIAL/PLATELET
Abs Immature Granulocytes: 0.04 10*3/uL (ref 0.00–0.07)
Basophils Absolute: 0 10*3/uL (ref 0.0–0.1)
Basophils Relative: 0 %
Eosinophils Absolute: 0 10*3/uL (ref 0.0–0.5)
Eosinophils Relative: 0 %
HCT: 32.1 % — ABNORMAL LOW (ref 36.0–46.0)
Hemoglobin: 10.1 g/dL — ABNORMAL LOW (ref 12.0–15.0)
Immature Granulocytes: 0 %
Lymphocytes Relative: 24 %
Lymphs Abs: 2.6 10*3/uL (ref 0.7–4.0)
MCH: 27.6 pg (ref 26.0–34.0)
MCHC: 31.5 g/dL (ref 30.0–36.0)
MCV: 87.7 fL (ref 80.0–100.0)
Monocytes Absolute: 0.5 10*3/uL (ref 0.1–1.0)
Monocytes Relative: 5 %
Neutro Abs: 7.5 10*3/uL (ref 1.7–7.7)
Neutrophils Relative %: 71 %
Platelets: 473 10*3/uL — ABNORMAL HIGH (ref 150–400)
RBC: 3.66 MIL/uL — ABNORMAL LOW (ref 3.87–5.11)
RDW: 12.5 % (ref 11.5–15.5)
WBC: 10.8 10*3/uL — ABNORMAL HIGH (ref 4.0–10.5)
nRBC: 0 % (ref 0.0–0.2)

## 2023-04-03 LAB — COMPREHENSIVE METABOLIC PANEL
ALT: 11 U/L (ref 0–44)
AST: 13 U/L — ABNORMAL LOW (ref 15–41)
Albumin: 3.4 g/dL — ABNORMAL LOW (ref 3.5–5.0)
Alkaline Phosphatase: 70 U/L (ref 38–126)
Anion gap: 12 (ref 5–15)
BUN: 24 mg/dL — ABNORMAL HIGH (ref 8–23)
CO2: 24 mmol/L (ref 22–32)
Calcium: 8.9 mg/dL (ref 8.9–10.3)
Chloride: 101 mmol/L (ref 98–111)
Creatinine, Ser: 1.26 mg/dL — ABNORMAL HIGH (ref 0.44–1.00)
GFR, Estimated: 45 mL/min — ABNORMAL LOW (ref >60)
Glucose, Bld: 104 mg/dL — ABNORMAL HIGH (ref 70–99)
Potassium: 3.6 mmol/L (ref 3.5–5.1)
Sodium: 137 mmol/L (ref 135–145)
Total Bilirubin: 0.7 mg/dL (ref 0.3–1.2)
Total Protein: 7.5 g/dL (ref 6.5–8.1)

## 2023-04-03 LAB — CBG MONITORING, ED: Glucose-Capillary: 87 mg/dL (ref 70–99)

## 2023-04-03 LAB — TROPONIN I (HIGH SENSITIVITY)
Troponin I (High Sensitivity): 13 ng/L (ref <18)
Troponin I (High Sensitivity): 13 ng/L (ref <18)

## 2023-04-03 MED ORDER — SODIUM CHLORIDE 0.9 % IV BOLUS
500.0000 mL | Freq: Once | INTRAVENOUS | Status: AC
  Administered 2023-04-03: 500 mL via INTRAVENOUS

## 2023-04-03 NOTE — ED Notes (Signed)
Orthostatic VS completed. Standing at 0 mins was tolerable, pt still feeling weak but doable. After 2 mins of standing, she stated she felt so weak and a little dizzy. Patient back in bed.
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
## 2023-04-03 NOTE — ED Triage Notes (Signed)
Pt fell yesterday morning. Patient has been feeling pain from the fall in her head, knees, and numbness in her feet since. Patient has been feeling dizzy since.

## 2023-04-03 NOTE — ED Notes (Signed)
Pt ambulated in hall no complaints of dizziness, reports feeling mildly weak. VSS after ambulation.

## 2023-04-07 DIAGNOSIS — R55 Syncope and collapse: Secondary | ICD-10-CM | POA: Diagnosis not present

## 2023-04-07 DIAGNOSIS — K219 Gastro-esophageal reflux disease without esophagitis: Secondary | ICD-10-CM | POA: Diagnosis not present

## 2023-04-07 DIAGNOSIS — E860 Dehydration: Secondary | ICD-10-CM | POA: Diagnosis not present

## 2023-04-07 DIAGNOSIS — M170 Bilateral primary osteoarthritis of knee: Secondary | ICD-10-CM | POA: Diagnosis not present

## 2023-04-07 DIAGNOSIS — I10 Essential (primary) hypertension: Secondary | ICD-10-CM | POA: Diagnosis not present

## 2023-04-08 DIAGNOSIS — Z0001 Encounter for general adult medical examination with abnormal findings: Secondary | ICD-10-CM | POA: Diagnosis not present

## 2023-04-08 DIAGNOSIS — Z23 Encounter for immunization: Secondary | ICD-10-CM | POA: Diagnosis not present

## 2023-04-08 DIAGNOSIS — D649 Anemia, unspecified: Secondary | ICD-10-CM | POA: Diagnosis not present

## 2023-04-08 DIAGNOSIS — E785 Hyperlipidemia, unspecified: Secondary | ICD-10-CM | POA: Diagnosis not present

## 2023-04-08 DIAGNOSIS — I10 Essential (primary) hypertension: Secondary | ICD-10-CM | POA: Diagnosis not present

## 2023-04-09 ENCOUNTER — Telehealth: Payer: Self-pay

## 2023-04-09 NOTE — Telephone Encounter (Signed)
Transition Care Management Unsuccessful Follow-up Telephone Call  Date of discharge and from where:  [name]  Attempts:  1st Attempt  Reason for unsuccessful TCM follow-up call:  No answer/busy   [name] [facility] Guide, [facility] [phone] [address] [place], [place], [place] [address] Phone: [phone] Email: [email or web address]

## 2023-04-12 ENCOUNTER — Telehealth: Payer: Self-pay

## 2023-04-12 NOTE — Telephone Encounter (Signed)
Transition Care Management Unsuccessful Follow-up Telephone Call  Date of discharge and from where:  [name]  Attempts:  2nd Attempt  Reason for unsuccessful TCM follow-up call:  Left voice message   [name] [facility] Guide, [facility] [phone] [address] [place], [place], [place] [address] Phone: [phone] Email: [email or web address]

## 2023-04-29 ENCOUNTER — Ambulatory Visit: Payer: Medicare Other | Admitting: Orthopedic Surgery

## 2023-04-29 ENCOUNTER — Encounter: Payer: Self-pay | Admitting: Orthopedic Surgery

## 2023-04-29 VITALS — BP 148/79 | HR 80 | Ht 61.0 in | Wt 140.0 lb

## 2023-04-29 DIAGNOSIS — M4712 Other spondylosis with myelopathy, cervical region: Secondary | ICD-10-CM | POA: Diagnosis not present

## 2023-04-29 DIAGNOSIS — M542 Cervicalgia: Secondary | ICD-10-CM | POA: Diagnosis not present

## 2023-04-29 MED ORDER — PREDNISONE 10 MG PO TABS
10.0000 mg | ORAL_TABLET | Freq: Three times a day (TID) | ORAL | 0 refills | Status: DC
Start: 2023-04-29 — End: 2023-05-21

## 2023-04-29 NOTE — Progress Notes (Signed)
Chief Complaint  Patient presents with   Neck Pain    Has neck pain into both arms goes all the way down into the left hand    Arm Pain    Bilateral    This is a 77 year old female presents to [place] after being seen in the ER after passing out.  At that same time she complained of a 3-week history of history of bilateral shoulder pain neck pain and pain radiating down both upper extremities.  She took some ibuprofen and some aspirin did not get relief  She denies weakness in the hands complains of bilateral upper extremity swelling including the left hand worse than right as well as numbness and tingling in both upper extremities  Review of systems blackout, fall, numbness tingling as described.  Problem list, medical hx, medications and allergies reviewed   BP (!) 148/79   Pulse 80   Ht 5\' 1"  (1.549 m)   Wt 140 lb (63.5 kg)   BMI 26.45 kg/m   Physical Exam Vitals and nursing note reviewed.  Constitutional:      Appearance: Normal appearance.  HENT:     Head: Normocephalic and atraumatic.  Eyes:     General: No scleral icterus.       Right eye: No discharge.        Left eye: No discharge.     Extraocular Movements: Extraocular movements intact.     Conjunctiva/sclera: Conjunctivae normal.     Pupils: Pupils are equal, round, and reactive to light.  Cardiovascular:     Rate and Rhythm: Normal rate.  Musculoskeletal:     Comments: Cervical spine: Tenderness in the midline and also in each side of the muscles of the cervical spine with decreased range of motion some crepitance.  Upper extremities weak grip strength hyperreflexia bilaterally normal pulses  Skin:    General: Skin is warm and dry.     Capillary Refill: Capillary refill takes less than 2 seconds.  Neurological:     General: No focal deficit present.     Mental Status: She is alert and oriented to person, place, and time.  Psychiatric:        Mood and Affect: Mood normal.        Behavior: Behavior normal.         Thought Content: Thought content normal.        Judgment: Judgment normal.    Assessment and plan  Imaging of the cervical spine from the emergency room: I interpret this image as cervical spondylosis moderate to severe with abnormal cervical spinal alignment to space narrowing osteophyte formation  CT cervical spine shows again cervical spondylosis severe disc space narrowing at certain levels  Further imaging of neural elements required to assess for cervical myelopathy  Imaging of the cervical spine was done back in 2019 so her chronic problem has exacerbated  Start prednisone  Meds ordered this encounter  Medications   predniSONE (DELTASONE) 10 MG tablet    Sig: Take 1 tablet (10 mg total) by mouth 3 (three) times daily.    Dispense:  42 tablet    Refill:  0    Encounter Diagnoses  Name Primary?   Cervicalgia    Other spondylosis with myelopathy, cervical region Yes    Follow-up after MRI results for determination of definitive management

## 2023-04-29 NOTE — Patient Instructions (Signed)
Your insurance does not require approval for MRI please go ahead and call to schedule your appointment with [facility] within at least one (1) week.   Central Scheduling [phone]

## 2023-05-05 DIAGNOSIS — E785 Hyperlipidemia, unspecified: Secondary | ICD-10-CM | POA: Diagnosis not present

## 2023-05-05 DIAGNOSIS — I10 Essential (primary) hypertension: Secondary | ICD-10-CM | POA: Diagnosis not present

## 2023-05-08 DIAGNOSIS — E785 Hyperlipidemia, unspecified: Secondary | ICD-10-CM | POA: Diagnosis not present

## 2023-05-08 DIAGNOSIS — I10 Essential (primary) hypertension: Secondary | ICD-10-CM | POA: Diagnosis not present

## 2023-05-12 ENCOUNTER — Ambulatory Visit (HOSPITAL_COMMUNITY)
Admission: RE | Admit: 2023-05-12 | Discharge: 2023-05-12 | Disposition: A | Discharge status: Home / Self Care | Payer: Medicare Other | Source: Ambulatory Visit | Attending: Orthopedic Surgery | Admitting: Orthopedic Surgery

## 2023-05-12 ENCOUNTER — Encounter (HOSPITAL_COMMUNITY): Payer: Self-pay

## 2023-05-12 DIAGNOSIS — M542 Cervicalgia: Secondary | ICD-10-CM

## 2023-05-13 ENCOUNTER — Telehealth: Payer: Self-pay | Admitting: Orthopedic Surgery
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
## 2023-05-13 NOTE — Addendum Note (Signed)
Addended byCaffie Damme on: 05/13/2023 04:57 PM   Modules accepted: Orders

## 2023-05-13 NOTE — Telephone Encounter (Signed)
I called [facility] to request more specific reading /addendum I spoke to [place] and she will ask radiologist to look at it again more specifically so we can get additional details  I will look for addendum and we will contact the patient.

## 2023-05-14 NOTE — Telephone Encounter (Signed)
ADDENDUM REPORT: 05/13/2023 18:39  ADDENDUM: Degenerative disc space narrowing greatest at C6-7. Degenerative facet spurring which is fairly focal on the left at C7-T1. No bony spinal canal or foraminal impingement. No gross herniation.   Electronically Signed   By: Tiburcio Pea M.D. [name] Kitchen..  Signed by Audry Riles, MD on 05/13/2023  6:41 PM

## 2023-05-18 ENCOUNTER — Telehealth: Payer: Self-pay | Admitting: Orthopedic Surgery

## 2023-05-18 ENCOUNTER — Other Ambulatory Visit (HOSPITAL_COMMUNITY): Payer: Self-pay | Admitting: Internal Medicine

## 2023-05-18 DIAGNOSIS — Z1231 Encounter for screening mammogram for malignant neoplasm of breast: Secondary | ICD-10-CM

## 2023-05-18 NOTE — Telephone Encounter (Signed)
Tried to return the patient's call, her voicemail has not been setup.

## 2023-05-21 ENCOUNTER — Ambulatory Visit: Payer: Medicare Other | Admitting: Orthopedic Surgery

## 2023-05-21 ENCOUNTER — Encounter: Payer: Self-pay | Admitting: Orthopedic Surgery

## 2023-05-21 VITALS — BP 137/74 | HR 65 | Ht 61.0 in | Wt 140.0 lb

## 2023-05-21 DIAGNOSIS — M4712 Other spondylosis with myelopathy, cervical region: Secondary | ICD-10-CM

## 2023-05-21 DIAGNOSIS — M542 Cervicalgia: Secondary | ICD-10-CM

## 2023-05-21 MED ORDER — GABAPENTIN 100 MG PO CAPS
100.0000 mg | ORAL_CAPSULE | Freq: Three times a day (TID) | ORAL | 2 refills | Status: DC
Start: 2023-05-21 — End: 2023-09-01

## 2023-05-21 MED ORDER — MELOXICAM 7.5 MG PO TABS
7.5000 mg | ORAL_TABLET | Freq: Every day | ORAL | 5 refills | Status: DC
Start: 2023-05-21 — End: 2023-11-15

## 2023-05-21 NOTE — Progress Notes (Signed)
Chief Complaint  Patient presents with   Neck Pain    Could not have MRI, radiologist did addend the CT scan report patient states she feels better since taking prednisone    Whitney Patterson complains of bilateral arm pain with pain in her cervical spine radiating down both arms into her hands  We sent her for MRI but she has a contraindication to MRI  Since she had had a previous CT scan for a fall we had then reread the CT scan in the setting of looking more for degenerative disc changes versus actual fracture  An addendum was placed as noted below ADDENDUM:  Degenerative disc space narrowing greatest at C6-7. Degenerative  facet spurring which is fairly focal on the left at C7-T1. No bony  spinal canal or foraminal impingement. No gross herniation.   I used a model to go over the findings with the patient and gave her the options of physical therapy, injection, medication  The patient does not want to get injections and she said her husband is sick and does not want to go to therapy so I placed her on Celebrex 100 mg twice a day and gabapentin 100 mg a day  Addendum meloxicam every other day allergic to sulfonamides  I have no other interventions to offer at this time follow-up as needed  Meds ordered this encounter  Medications   meloxicam (MOBIC) 7.5 MG tablet    Sig: Take 1 tablet (7.5 mg total) by mouth daily.    Dispense:  30 tablet    Refill:  5   gabapentin (NEURONTIN) 100 MG capsule    Sig: Take 1 capsule (100 mg total) by mouth 3 (three) times daily.    Dispense:  90 capsule    Refill:  2

## 2023-06-02 ENCOUNTER — Encounter: Payer: Self-pay | Admitting: Internal Medicine

## 2023-06-08 DIAGNOSIS — E785 Hyperlipidemia, unspecified: Secondary | ICD-10-CM | POA: Diagnosis not present

## 2023-06-08 DIAGNOSIS — I10 Essential (primary) hypertension: Secondary | ICD-10-CM | POA: Diagnosis not present

## 2023-06-25 ENCOUNTER — Ambulatory Visit (HOSPITAL_COMMUNITY)
Admission: RE | Admit: 2023-06-25 | Discharge: 2023-06-25 | Disposition: A | Discharge status: Home / Self Care | Payer: Medicare Other | Source: Ambulatory Visit | Attending: Internal Medicine | Admitting: Internal Medicine

## 2023-06-25 DIAGNOSIS — Z1231 Encounter for screening mammogram for malignant neoplasm of breast: Secondary | ICD-10-CM | POA: Diagnosis not present

## 2023-07-08 DIAGNOSIS — E785 Hyperlipidemia, unspecified: Secondary | ICD-10-CM | POA: Diagnosis not present

## 2023-07-08 DIAGNOSIS — I10 Essential (primary) hypertension: Secondary | ICD-10-CM | POA: Diagnosis not present

## 2023-07-09 DIAGNOSIS — D509 Iron deficiency anemia, unspecified: Secondary | ICD-10-CM | POA: Diagnosis not present

## 2023-07-09 DIAGNOSIS — I10 Essential (primary) hypertension: Secondary | ICD-10-CM | POA: Diagnosis not present

## 2023-07-15 ENCOUNTER — Encounter: Payer: Self-pay | Admitting: Internal Medicine

## 2023-07-15 ENCOUNTER — Ambulatory Visit: Payer: Medicare Other | Admitting: Internal Medicine

## 2023-07-15 VITALS — BP 145/74 | HR 76 | Temp 98.4°F | Ht 61.0 in | Wt 143.6 lb

## 2023-07-15 DIAGNOSIS — K5909 Other constipation: Secondary | ICD-10-CM

## 2023-07-15 DIAGNOSIS — K219 Gastro-esophageal reflux disease without esophagitis: Secondary | ICD-10-CM | POA: Diagnosis not present

## 2023-07-15 DIAGNOSIS — K5904 Chronic idiopathic constipation: Secondary | ICD-10-CM

## 2023-07-15 DIAGNOSIS — D509 Iron deficiency anemia, unspecified: Secondary | ICD-10-CM

## 2023-07-15 NOTE — Progress Notes (Signed)
Referring Provider: Benetta Spar* Primary Care Physician:  Benetta Spar, MD Primary GI:  Dr. Marletta Lor  Chief Complaint  Patient presents with   Follow-up    Follow up on constipation. Medication for constipation works    HPI:   Whitney Patterson is a 77 y.o. female who presents to the clinic today for yearly follow-up visit.  Chronic GERD: Well-controlled omeprazole daily.  No dysphagia odynophagia.  No epigastric or chest pain.  Chronic constipation: Well-controlled on as needed milk of magnesia.  Takes this 1-2 times weekly.  States she is increased her water intake which has helped her symptoms immensely.  Iron deficiency anemia: Most recent hemoglobin that I can see 10.1.  Currently taking oral iron.  Denies any hematochezia.  Does note dark stools in the setting of p.o. iron.  Colonoscopy 07/15/2022 with 1 small tubular adenoma removed.  No recall given her age.  Past Medical History:  Diagnosis Date   Arthritis    Constipation    Gastroesophageal reflux disease    Heart burn    High cholesterol    History of anemia    HTN (hypertension)    Osteoporosis    Sigmoid diverticulosis     Past Surgical History:  Procedure Laterality Date   COLONOSCOPY  09/15/2005   Few tiny diverticula at sigmoid colon, otherwise normal colonoscopy.   COLONOSCOPY WITH PROPOFOL N/A 07/15/2022   Procedure: COLONOSCOPY WITH PROPOFOL;  Surgeon: Lanelle Bal, DO;  [facility]: [facility];  Service: Endoscopy;  Laterality: N/A;  8:30 am   ESOPHAGOGASTRODUODENOSCOPY  09/15/2005   Normal examination of esophagus. Esophagus dilated by passing 54-French  Maloney dilator given history of dysphagia but without mucosal disruption/ Nonerosive antral gastritis   FOOT SURGERY     NASAL SINUS SURGERY      A BULLET REMOVED FROM HER RIGHT FRONTAL SINUS 20 + YEARS AGO    PARTIAL HYSTERECTOMY     POLYPECTOMY  07/15/2022   Procedure: POLYPECTOMY;  Surgeon: Lanelle Bal, DO;   Location: [facility];  Service: Endoscopy;;   STOMACH SURGERY      Current Outpatient Medications  Medication Sig Dispense Refill   acetaminophen (TYLENOL) 500 MG tablet Take 1 tablet (500 mg total) by mouth every 6 (six) hours as needed. 30 tablet 0   alendronate (FOSAMAX) 70 MG tablet Take 70 mg by mouth every Monday. In the morning     amLODipine (NORVASC) 10 MG tablet Take 10 mg by mouth daily.     Calcium Carb-Cholecalciferol (CALCIUM 600+D3 PO) Take 1 tablet by mouth in the morning.     ferrous sulfate 325 (65 FE) MG tablet Take 325 mg by mouth 2 (two) times daily with a meal.     gabapentin (NEURONTIN) 100 MG capsule Take 1 capsule (100 mg total) by mouth 3 (three) times daily. 90 capsule 2   lisinopril-hydrochlorothiazide (ZESTORETIC) 20-12.5 MG tablet Take 1 tablet by mouth every morning.     lovastatin (MEVACOR) 20 MG tablet Take 20 mg by mouth every morning.      meloxicam (MOBIC) 7.5 MG tablet Take 1 tablet (7.5 mg total) by mouth daily. 30 tablet 5   mirtazapine (REMERON) 15 MG tablet Take 15 mg by mouth at bedtime.     omeprazole (PRILOSEC) 20 MG capsule Take 20 mg by mouth every morning.      Polyethyl Glycol-Propyl Glycol (SYSTANE) 0.4-0.3 % SOLN Place 1 drop into both eyes in the morning and at bedtime.  No current facility-administered medications for this visit.    Allergies as of 07/15/2023 - Review Complete 07/15/2023  Allergen Reaction Noted   Codeine Other (See Comments)    Penicillins Swelling    Phenytoin Hives    Sulfonamide derivatives Other (See Comments)     Family History  Problem Relation Age of Onset   Heart disease Other    Arthritis Other    Cancer Other    Diabetes Other    Colon cancer Father 55    Social History   Socioeconomic History   Marital status: Married    Spouse name: Not on file   Number of children: 0   Years of education: 8th grade   Highest education level: Not on file  Occupational History   Occupation: maid     Employer: [employer]  Tobacco Use   Smoking status: Never   Smokeless tobacco: Never  Vaping Use   Vaping status: Never Used  Substance and Sexual Activity   Alcohol use: No    Comment: heavy etoh x10yrs, quit 1991   Drug use: No   Sexual activity: Not Currently  Other Topics Concern   Not on file  Social History Narrative   Not on file   Social Determinants of Health   Financial Resource Strain: Not on file  Food Insecurity: Not on file  Transportation Needs: Not on file  Physical Activity: Not on file  Stress: Not on file  Social Connections: Not on file    Subjective: Review of Systems  Constitutional:  Negative for chills and fever.  HENT:  Negative for congestion and hearing loss.   Eyes:  Negative for blurred vision and double vision.  Respiratory:  Negative for cough and shortness of breath.   Cardiovascular:  Negative for chest pain and palpitations.  Gastrointestinal:  Positive for constipation and heartburn. Negative for abdominal pain, blood in stool, diarrhea, melena and vomiting.  Genitourinary:  Negative for dysuria and urgency.  Musculoskeletal:  Negative for joint pain and myalgias.  Skin:  Negative for itching and rash.  Neurological:  Negative for dizziness and headaches.  Psychiatric/Behavioral:  Negative for depression. The patient is not nervous/anxious.      Objective: BP (!) 145/74   Pulse 76   Temp 98.4 F (36.9 C)   Ht 5\' 1"  (1.549 m)   Wt 143 lb 9.6 oz (65.1 kg)   BMI 27.13 kg/m  Physical Exam Constitutional:      Appearance: Normal appearance.  HENT:     Head: Normocephalic and atraumatic.  Eyes:     Extraocular Movements: Extraocular movements intact.     Conjunctiva/sclera: Conjunctivae normal.  Cardiovascular:     Rate and Rhythm: Normal rate and regular rhythm.  Pulmonary:     Effort: Pulmonary effort is normal.     Breath sounds: Normal breath sounds.  Abdominal:     General: Bowel sounds are normal.     Palpations:  Abdomen is soft.  Musculoskeletal:        General: No swelling. Normal range of motion.     Cervical back: Normal range of motion and neck supple.  Skin:    General: Skin is warm and dry.     Coloration: Skin is not jaundiced.  Neurological:     General: No focal deficit present.     Mental Status: She is alert and oriented to person, place, and time.  Psychiatric:        Mood and Affect: Mood normal.  Behavior: Behavior normal.      Assessment/Plan:  1.  Chronic GERD-well-controlled on omeprazole daily.  Will continue.  2.  Constipation-chronic, well-controlled on as needed milk of magnesia.  Continue.  Continue to implement adequate hydration with water daily.  3.  Iron deficiency anemia-chronically on oral iron.  Most recent hemoglobin 10.1.  Follow-up in 3 months with CBC and iron studies.  If not improved, may need to consider further evaluation from a GI standpoint including upper endoscopy and/or capsule endoscopy.   07/15/2023 10:25 AM   Disclaimer: This note was dictated with voice recognition software. Similar sounding words can inadvertently be transcribed and may not be corrected upon review.

## 2023-07-15 NOTE — Patient Instructions (Addendum)
Continue on omeprazole for your chronic acid reflux.  Continue milk of magnesia as needed for your constipation.  Continue oral iron for your anemia.  We may need to consider further workup from a GI standpoint if your hemoglobin continues to drop.    Follow-up in 3 months.  It was very nice seeing you again today.  Dr. Marletta Lor

## 2023-07-16 ENCOUNTER — Encounter: Payer: Self-pay | Admitting: Orthopedic Surgery

## 2023-07-16 ENCOUNTER — Ambulatory Visit: Payer: Medicare Other | Admitting: Orthopedic Surgery

## 2023-07-16 ENCOUNTER — Other Ambulatory Visit (INDEPENDENT_AMBULATORY_CARE_PROVIDER_SITE_OTHER): Payer: Medicare Other

## 2023-07-16 ENCOUNTER — Other Ambulatory Visit: Payer: Self-pay

## 2023-07-16 VITALS — BP 155/75 | HR 68 | Ht 61.0 in | Wt 140.0 lb

## 2023-07-16 DIAGNOSIS — G8929 Other chronic pain: Secondary | ICD-10-CM

## 2023-07-16 DIAGNOSIS — M79642 Pain in left hand: Secondary | ICD-10-CM

## 2023-07-16 DIAGNOSIS — M79641 Pain in right hand: Secondary | ICD-10-CM

## 2023-07-16 NOTE — Progress Notes (Signed)
   Office Visit Note   Patient: Whitney Patterson           Date of Birth: 05/28/1946           MRN: [id number] Visit Date: 07/16/2023 Requested by: Benetta Spar, MD [address] [place],  [place] [address] PCP: Benetta Spar, MD   Assessment & Plan:   Encounter Diagnoses  Name Primary?   Chronic hand pain, right Yes   Chronic hand pain, left     No orders of the defined types were placed in this encounter.   77 year old female history of carpal tunnel syndrome unclear etiology of current symptoms.  First step is to rule out carpal tunnel syndrome.  Patient will return after carpal tunnel testing with nerve conduction study   Subjective: Chief Complaint  Patient presents with   Hand Pain    Feels cold and tight x 3 weeks/ bilateral    History patient complains of 3 weeks history of hands feeling tight and cold does not really complain of pain or numbness no history of trauma history of carpal tunnel syndrome  Exam of the hands The hands look swollen there are several nodules around the small joints of the fingers most of the symptoms resolved in the fingers distal to the metacarpal phalangeal joints although there is no tenderness color capillary refill temperature normal radial pulse normal ulnar artery pulse normal as well Range of motion normal   Imaging  Normal x-rays of the hand no acute pathology no periosteal joint erosions to suggest inflammatory arthritis  Rule out carpal tunnel syndrome  Return after to carpal tunnel nerve conduction study testing

## 2023-07-16 NOTE — Addendum Note (Signed)
Addended by: Michaele Offer on: 07/16/2023 11:24 AM   Modules accepted: Orders

## 2023-07-16 NOTE — Patient Instructions (Signed)
We are referring you to [facility] from [facility] Office address is [address] [place] The phone number is [phone]  The office will call you with an appointment Dr. Newton will do the nerve study   

## 2023-07-27 ENCOUNTER — Ambulatory Visit (INDEPENDENT_AMBULATORY_CARE_PROVIDER_SITE_OTHER): Payer: Medicare Other | Admitting: Physical Medicine and Rehabilitation

## 2023-07-27 DIAGNOSIS — M542 Cervicalgia: Secondary | ICD-10-CM | POA: Diagnosis not present

## 2023-07-27 DIAGNOSIS — G8929 Other chronic pain: Secondary | ICD-10-CM | POA: Diagnosis not present

## 2023-07-27 DIAGNOSIS — R531 Weakness: Secondary | ICD-10-CM | POA: Diagnosis not present

## 2023-07-27 DIAGNOSIS — R202 Paresthesia of skin: Secondary | ICD-10-CM

## 2023-07-27 DIAGNOSIS — M25512 Pain in left shoulder: Secondary | ICD-10-CM | POA: Diagnosis not present

## 2023-07-27 DIAGNOSIS — M79641 Pain in right hand: Secondary | ICD-10-CM

## 2023-07-27 DIAGNOSIS — M25511 Pain in right shoulder: Secondary | ICD-10-CM | POA: Diagnosis not present

## 2023-07-27 DIAGNOSIS — M79642 Pain in left hand: Secondary | ICD-10-CM

## 2023-07-27 NOTE — Progress Notes (Unsigned)
Whitney Patterson - 77 y.o. female MRN [id number]  Date of birth: 1946/05/29  Office Visit Note: Visit Date: 07/27/2023 PCP: Benetta Spar, MD Referred by: Vickki Hearing, MD  Subjective: Chief Complaint  Patient presents with   Left Arm - Pain, Numbness, Tingling, Weakness   Right Arm - Pain, Numbness, Weakness, Tingling   HPI: Whitney Patterson is a 77 y.o. female who comes in todayHPI   ROS Otherwise per HPI.  Assessment & Plan: Visit Diagnoses:    ICD-10-CM   1. 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  The location of the neuropathy is somewhat interesting in that she does not have frank conduction velocity decrease across the wrist but this could be due to difficulty in measuring with the muscle atrophy present.  This could represent a more proximal lesion such as a pronator syndrome.  However, the most likely location of the lesion is at the carpal tunnel.  **While there are no findings consistent with radiculopathy she does have bilateral Hoffmann's sign and symptoms down the arms and this could be consistent with a myelopathy.  Prior CT scan in July did not show any central canal narrowing but she may need MRI of the cervical spine.  There is no significant electrodiagnostic evidence of any other focal nerve entrapment, brachial plexopathy or cervical radiculopathy.   Recommendations: 1.  Follow-up with referring physician. 2.  Continue current management of symptoms. 3.  Suggest surgical evaluation. 4.  Suggest cervical MRI.  Meds & Orders: No orders of the defined types were placed in this encounter.   Orders Placed This Encounter  Procedures   NCV with EMG (electromyography)    Follow-up: No follow-ups on file.   Procedures: No procedures performed   EMG & NCV Findings: Evaluation of the left median motor and the right median motor nerves showed prolonged distal onset latency (L6.6, R5.3 ms) and reduced amplitude (L1.9, R0.5 mV).  The left median (across palm) sensory nerve showed no response (Palm), prolonged distal peak latency (6.6 ms), and reduced amplitude (5.0 V).  The right median (across palm) sensory nerve showed no response (Palm) and prolonged distal peak latency (5.3 ms).  All remaining nerves (as indicated in the following tables) were within normal limits.  Left vs. Right side comparison data for the median motor nerve indicates abnormal L-R latency difference (1.3 ms) and abnormal L-R amplitude difference (73.7 %).  All remaining left vs. right side differences were within normal limits.    Needle evaluation of the right abductor pollicis brevis muscle showed decreased insertional activity, increased spontaneous activity, and diminished recruitment.  All remaining muscles (as indicated in the following table) showed no evidence of electrical instability.    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  The location of the neuropathy is somewhat interesting in that she does not have frank conduction velocity decrease across the wrist but this could be due to difficulty in measuring with the muscle atrophy present.  This could represent a more proximal lesion such as a pronator syndrome.  However, the most likely location of the lesion is at the carpal tunnel.  **While there are no findings consistent with radiculopathy she does have bilateral Hoffmann's sign and symptoms  down the arms and this could be consistent with a myelopathy.  Prior CT scan in July did not show any central canal narrowing but she may need MRI of the cervical spine.  There is no significant electrodiagnostic evidence of any other focal nerve  entrapment, brachial plexopathy or cervical radiculopathy.   Recommendations: 1.  Follow-up with referring physician. 2.  Continue current management of symptoms. 3.  Suggest surgical evaluation. 4.  Suggest cervical MRI.  ___________________________ Naaman Plummer FAAPMR Board Certified, American Board of Physical Medicine and Rehabilitation    Nerve Conduction Studies Anti Sensory Summary Table   Stim Site NR Peak (ms) Norm Peak (ms) P-T Amp (V) Norm P-T Amp Site1 Site2 Delta-P (ms) Dist (cm) Vel (m/s) Norm Vel (m/s)  Left Median Acr Palm Anti Sensory (2nd Digit)  31.9C  Wrist    *6.6 <3.6 *5.0 >10 Wrist Palm  0.0    Palm *NR  <2.0          Right Median Acr Palm Anti Sensory (2nd Digit)  30.9C  Wrist    *5.3 <3.6 16.6 >10 Wrist Palm  0.0    Palm *NR  <2.0          Left Radial Anti Sensory (Base 1st Digit)  32.1C  Wrist    2.5 <3.1 7.1  Wrist Base 1st Digit 2.5 0.0    Right Radial Anti Sensory (Base 1st Digit)  31.7C  Wrist    2.2 <3.1 22.3  Wrist Base 1st Digit 2.2 0.0    Left Ulnar Anti Sensory (5th Digit)  32.6C  Wrist    3.7 <3.7 17.4 >15.0 Wrist 5th Digit 3.7 14.0 38 >38  Right Ulnar Anti Sensory (5th Digit)  32.1C  Wrist    3.4 <3.7 20.0 >15.0 Wrist 5th Digit 3.4 14.0 41 >38   Motor Summary Table   Stim Site NR Onset (ms) Norm Onset (ms) O-P Amp (mV) Norm O-P Amp Site1 Site2 Delta-0 (ms) Dist (cm) Vel (m/s) Norm Vel (m/s)  Left Median Motor (Abd Poll Brev)  32.1C    martin-gruber  Wrist    *6.6 <4.2 *1.9 >5 Elbow Wrist 3.8 20.0 53 >50  Elbow    10.4  1.4         Right Median Motor (Abd Poll Brev)  32.2C  Wrist    *5.3 <4.2 *0.5 >5 Elbow Wrist 3.8 20.0 53 >50  Elbow    9.1  1.2         Left Ulnar Motor (Abd Dig Min)  32.1C  Wrist    3.2 <4.2 4.8 >3 B Elbow Wrist 2.7 17.0 63 >53  B Elbow    5.9  2.4  A Elbow B Elbow 1.5 10.0 67 >53  A Elbow    7.4  2.4         Right Ulnar Motor (Abd Dig Min)  32.6C  Wrist    2.7 <4.2 5.3 >3 B Elbow Wrist 3.2 19.0 59 >53  B  Elbow    5.9  3.5  A Elbow B Elbow 1.4 10.0 71 >53  A Elbow    7.3  3.3          EMG   Side Muscle Nerve Root Ins Act Fibs Psw Amp Dur Poly Recrt Int Dennie Bible Comment  Right Abd Poll Brev Median C8-T1 *Decr *3+ *3+ Nml Nml 0 *Reduced Nml   Right 1stDorInt Ulnar C8-T1 Nml Nml Nml Nml Nml 0 Nml Nml   Right PronatorTeres Median C6-7 Nml  Nml Nml Nml Nml 0 Nml Nml   Right Biceps Musculocut C5-6 Nml Nml Nml Nml Nml 0 Nml Nml   Right Deltoid Axillary C5-6 Nml Nml Nml Nml Nml 0 Nml Nml     Nerve Conduction Studies Anti Sensory Left/Right Comparison   Stim Site L Lat (ms) R Lat (ms) L-R Lat (ms) L Amp (V) R Amp (V) L-R Amp (%) Site1 Site2 L Vel (m/s) R Vel (m/s) L-R Vel (m/s)  Median Acr Palm Anti Sensory (2nd Digit)  31.9C  Wrist *6.6 *5.3 1.3 *5.0 16.6 69.9 Wrist Palm     Palm             Radial Anti Sensory (Base 1st Digit)  32.1C  Wrist 2.5 2.2 0.3 7.1 22.3 68.2 Wrist Base 1st Digit     Ulnar Anti Sensory (5th Digit)  32.6C  Wrist 3.7 3.4 0.3 17.4 20.0 13.0 Wrist 5th Digit 38 41 3   Motor Left/Right Comparison   Stim Site L Lat (ms) R Lat (ms) L-R Lat (ms) L Amp (mV) R Amp (mV) L-R Amp (%) Site1 Site2 L Vel (m/s) R Vel (m/s) L-R Vel (m/s)  Median Motor (Abd Poll Brev)  32.1C    martin-gruber  Wrist *6.6 *5.3 *1.3 *1.9 *0.5 *73.7 Elbow Wrist 53 53 0  Elbow 10.4 9.1 1.3 1.4 1.2 14.3       Ulnar Motor (Abd Dig Min)  32.1C  Wrist 3.2 2.7 0.5 4.8 5.3 9.4 B Elbow Wrist 63 59 4  B Elbow 5.9 5.9 0.0 2.4 3.5 31.4 A Elbow B Elbow 67 71 4  A Elbow 7.4 7.3 0.1 2.4 3.3 27.3          Waveforms:                      Clinical History: No specialty comments available.   She reports that she has never smoked. She has never used smokeless tobacco. No results for input(s): "HGBA1C", "LABURIC" in the last 8760 hours.  Objective:  VS:  HT:    WT:   BMI:     BP:   HR: bpm  TEMP: ( )  RESP:  Physical Exam  Ortho Exam  Imaging: No results found.  Past  Medical/Family/Surgical/Social History: Medications & Allergies reviewed per EMR, new medications updated. Patient Active Problem List   Diagnosis Date Noted   Colon cancer screening 04/22/2022   Fracture, humerus closed    Humerus fracture 08/01/2015   Concussion 08/01/2015   Rotator cuff disorder 09/13/2012   Family history of colon cancer 04/15/2012   Constipation 04/15/2012   Low back pain 05/12/2011   IMPINGEMENT SYNDROME 07/22/2009   HYPERLIPIDEMIA 05/22/2009   Essential hypertension 05/22/2009   GERD 05/22/2009   Diverticulosis of colon 05/22/2009   GOUT, HX OF 05/22/2009   ANEMIA, HX OF 05/22/2009   ARTHRITIS, HX OF 05/22/2009   Past Medical History:  Diagnosis Date   Arthritis    Constipation    Gastroesophageal reflux disease    Heart burn    High cholesterol    History of anemia    HTN (hypertension)    Osteoporosis    Sigmoid diverticulosis    Family History  Problem Relation Age of Onset   Heart disease Other    Arthritis Other    Cancer Other    Diabetes Other    Colon cancer Father 67   Past Surgical History:  Procedure Laterality Date   COLONOSCOPY  09/15/2005  Few tiny diverticula at sigmoid colon, otherwise normal colonoscopy.   COLONOSCOPY WITH PROPOFOL N/A 07/15/2022   Procedure: COLONOSCOPY WITH PROPOFOL;  Surgeon: Lanelle Bal, DO;  [facility];  Service: Endoscopy;  Laterality: N/A;  8:30 am   ESOPHAGOGASTRODUODENOSCOPY  09/15/2005   Normal examination of esophagus. Esophagus dilated by passing 54-French  Maloney dilator given history of dysphagia but without mucosal disruption/ Nonerosive antral gastritis   FOOT SURGERY     NASAL SINUS SURGERY      A BULLET REMOVED FROM HER RIGHT FRONTAL SINUS 20 + YEARS AGO    PARTIAL HYSTERECTOMY     POLYPECTOMY  07/15/2022   Procedure: POLYPECTOMY;  Surgeon: Lanelle Bal, DO;  [facility];  Service: Endoscopy;;   STOMACH SURGERY     Social History   Occupational  History   Occupation: maid    Employer: [employer]  Tobacco Use   Smoking status: Never   Smokeless tobacco: Never  Vaping Use   Vaping status: Never Used  Substance and Sexual Activity   Alcohol use: No    Comment: heavy etoh x36yrs, quit 1991   Drug use: No   Sexual activity: Not Currently

## 2023-07-27 NOTE — Progress Notes (Unsigned)
Functional Pain Scale - descriptive words and definitions  Distressing (6)    Pain is present/unable to complete most ADLs limited by pain/sleep is difficult and active distraction is only marginal. Moderate range order  Average Pain 6  BilUE NCS, pain, numbness, tingling in both arms. Difficultly grasping and getting hands to function. Pain can be up to 10 at night and mornings.

## 2023-07-27 NOTE — Procedures (Unsigned)
EMG & NCV Findings: Evaluation of the left median motor and the right median motor nerves showed prolonged distal onset latency (L6.6, R5.3 ms) and reduced amplitude (L1.9, R0.5 mV).  The left median (across palm) sensory nerve showed no response (Palm), prolonged distal peak latency (6.6 ms), and reduced amplitude (5.0 V).  The right median (across palm) sensory nerve showed no response (Palm) and prolonged distal peak latency (5.3 ms).  All remaining nerves (as indicated in the following tables) were within normal limits.  Left vs. Right side comparison data for the median motor nerve indicates abnormal L-R latency difference (1.3 ms) and abnormal L-R amplitude difference (73.7 %).  All remaining left vs. right side differences were within normal limits.    Needle evaluation of the right abductor pollicis brevis muscle showed decreased insertional activity, increased spontaneous activity, and diminished recruitment.  All remaining muscles (as indicated in the following table) showed no evidence of electrical instability.    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  The location of the neuropathy is somewhat interesting in that she does not have frank conduction velocity decrease across the wrist but this could be due to difficulty in measuring with the muscle atrophy present.  This could represent a more proximal lesion such as a pronator syndrome.  However, the most likely location of the lesion is at the carpal tunnel.  **While there are no findings consistent with radiculopathy she does have bilateral Hoffmann's sign and symptoms down the arms and this could be consistent with a myelopathy.  Prior CT scan in July did not show any central canal narrowing but she may need MRI of the cervical spine.  There is no significant electrodiagnostic evidence of any other focal nerve  entrapment, brachial plexopathy or cervical radiculopathy.   Recommendations: 1.  Follow-up with referring physician. 2.  Continue current management of symptoms. 3.  Suggest surgical evaluation. 4.  Suggest cervical MRI.  ___________________________ Whitney Patterson Whitney Patterson Whitney Patterson, Whitney Patterson    Nerve Conduction Studies Anti Sensory Summary Table   Stim Site NR Peak (ms) Norm Peak (ms) P-T Amp (V) Norm P-T Amp Site1 Site2 Delta-P (ms) Dist (cm) Vel (m/s) Norm Vel (m/s)  Left Median Acr Palm Anti Sensory (2nd Digit)  31.9C  Wrist    *6.6 <3.6 *5.0 >10 Wrist Palm  0.0    Palm *NR  <2.0          Right Median Acr Palm Anti Sensory (2nd Digit)  30.9C  Wrist    *5.3 <3.6 16.6 >10 Wrist Palm  0.0    Palm *NR  <2.0          Left Radial Anti Sensory (Base 1st Digit)  32.1C  Wrist    2.5 <3.1 7.1  Wrist Base 1st Digit 2.5 0.0    Right Radial Anti Sensory (Base 1st Digit)  31.7C  Wrist    2.2 <3.1 22.3  Wrist Base 1st Digit 2.2 0.0    Left Ulnar Anti Sensory (5th Digit)  32.6C  Wrist    3.7 <3.7 17.4 >15.0 Wrist 5th Digit 3.7 14.0 38 >38  Right Ulnar Anti Sensory (5th Digit)  32.1C  Wrist    3.4 <3.7 20.0 >15.0 Wrist 5th Digit 3.4 14.0 41 >38   Motor Summary Table   Stim Site NR Onset (ms) Norm Onset (ms) O-P Amp (mV) Norm O-P Amp Site1  Site2 Delta-0 (ms) Dist (cm) Vel (m/s) Norm Vel (m/s)  Left Median Motor (Abd Poll Brev)  32.1C    martin-gruber  Wrist    *6.6 <4.2 *1.9 >5 Elbow Wrist 3.8 20.0 53 >50  Elbow    10.4  1.4         Right Median Motor (Abd Poll Brev)  32.2C  Wrist    *5.3 <4.2 *0.5 >5 Elbow Wrist 3.8 20.0 53 >50  Elbow    9.1  1.2         Left Ulnar Motor (Abd Dig Min)  32.1C  Wrist    3.2 <4.2 4.8 >3 B Elbow Wrist 2.7 17.0 63 >53  B Elbow    5.9  2.4  A Elbow B Elbow 1.5 10.0 67 >53  A Elbow    7.4  2.4         Right Ulnar Motor (Abd Dig Min)  32.6C  Wrist    2.7 <4.2 5.3 >3 B Elbow Wrist 3.2 19.0 59 >53  B  Elbow    5.9  3.5  A Elbow B Elbow 1.4 10.0 71 >53  A Elbow    7.3  3.3          EMG   Side Muscle Nerve Root Ins Act Fibs Psw Amp Dur Poly Recrt Int Dennie Bible Comment  Right Abd Poll Brev Median C8-T1 *Decr *3+ *3+ Nml Nml 0 *Reduced Nml   Right 1stDorInt Ulnar C8-T1 Nml Nml Nml Nml Nml 0 Nml Nml   Right PronatorTeres Median C6-7 Nml Nml Nml Nml Nml 0 Nml Nml   Right Biceps Musculocut C5-6 Nml Nml Nml Nml Nml 0 Nml Nml   Right Deltoid Axillary C5-6 Nml Nml Nml Nml Nml 0 Nml Nml     Nerve Conduction Studies Anti Sensory Left/Right Comparison   Stim Site L Lat (ms) R Lat (ms) L-R Lat (ms) L Amp (V) R Amp (V) L-R Amp (%) Site1 Site2 L Vel (m/s) R Vel (m/s) L-R Vel (m/s)  Median Acr Palm Anti Sensory (2nd Digit)  31.9C  Wrist *6.6 *5.3 1.3 *5.0 16.6 69.9 Wrist Palm     Palm             Radial Anti Sensory (Base 1st Digit)  32.1C  Wrist 2.5 2.2 0.3 7.1 22.3 68.2 Wrist Base 1st Digit     Ulnar Anti Sensory (5th Digit)  32.6C  Wrist 3.7 3.4 0.3 17.4 20.0 13.0 Wrist 5th Digit 38 41 3   Motor Left/Right Comparison   Stim Site L Lat (ms) R Lat (ms) L-R Lat (ms) L Amp (mV) R Amp (mV) L-R Amp (%) Site1 Site2 L Vel (m/s) R Vel (m/s) L-R Vel (m/s)  Median Motor (Abd Poll Brev)  32.1C    martin-gruber  Wrist *6.6 *5.3 *1.3 *1.9 *0.5 *73.7 Elbow Wrist 53 53 0  Elbow 10.4 9.1 1.3 1.4 1.2 14.3       Ulnar Motor (Abd Dig Min)  32.1C  Wrist 3.2 2.7 0.5 4.8 5.3 9.4 B Elbow Wrist 63 59 4  B Elbow 5.9 5.9 0.0 2.4 3.5 31.4 A Elbow B Elbow 67 71 4  A Elbow 7.4 7.3 0.1 2.4 3.3 27.3          Waveforms:

## 2023-07-29 ENCOUNTER — Encounter: Payer: Self-pay | Admitting: Physical Medicine and Rehabilitation

## 2023-08-05 ENCOUNTER — Ambulatory Visit: Payer: Medicare Other | Admitting: Orthopedic Surgery

## 2023-08-05 DIAGNOSIS — M4712 Other spondylosis with myelopathy, cervical region: Secondary | ICD-10-CM | POA: Diagnosis not present

## 2023-08-05 DIAGNOSIS — G5602 Carpal tunnel syndrome, left upper limb: Secondary | ICD-10-CM

## 2023-08-05 DIAGNOSIS — M542 Cervicalgia: Secondary | ICD-10-CM

## 2023-08-05 NOTE — Progress Notes (Signed)
Follow-up visit  Chief Complaint  Patient presents with   Hand Pain    Review from dr newton of bil hand pain     , Encounter Diagnoses  Name Primary?   Cervicalgia Yes   Other spondylosis with myelopathy, cervical region    Carpal tunnel syndrome, left upper limb      Nerve conduction studies done by Dr. Alvester Morin to rule out carpal tunnel syndrome  As stated by Dr. Alvester Morin and previously noted in our office patient has cervical myelopathy  She also has signs of carpal tunnel on nerve conduction study  Recommend consult with Dr. 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  She does have some features that could be cervical radiculopathy radiculitis versus myelopathic issues.  Exam does show positive Hoffmann sign bilaterally but that is the only sign it is symmetric. 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  The location of the neuropathy is somewhat interesting in that she does not have frank conduction velocity decrease across the wrist but this could be due to difficulty in measuring with the muscle atrophy present.  This could represent a more proximal lesion such as a pronator syndrome.  However, the most likely location of the lesion is at the carpal tunnel.   **While there are no findings consistent with radiculopathy she does have bilateral Hoffmann's sign and symptoms down the arms and this could be consistent with a myelopathy.  Prior CT scan in July did not show any central canal narrowing but she may need CT myelogram cervical spine as MRI is contraindicated.   There is no significant electrodiagnostic evidence of any  other focal nerve entrapment, brachial plexopathy or cervical radiculopathy.

## 2023-08-05 NOTE — Patient Instructions (Signed)
We are referring you to [facility] from [facility] Office address is [address] [place] The phone number is [phone]  The office will call you with an appointment Dr. Moore   

## 2023-08-08 DIAGNOSIS — E785 Hyperlipidemia, unspecified: Secondary | ICD-10-CM | POA: Diagnosis not present

## 2023-08-08 DIAGNOSIS — I10 Essential (primary) hypertension: Secondary | ICD-10-CM | POA: Diagnosis not present

## 2023-08-31 ENCOUNTER — Other Ambulatory Visit: Payer: Self-pay | Admitting: Orthopedic Surgery

## 2023-08-31 DIAGNOSIS — M4712 Other spondylosis with myelopathy, cervical region: Secondary | ICD-10-CM

## 2023-09-06 ENCOUNTER — Ambulatory Visit: Payer: Medicare Other | Admitting: Orthopedic Surgery

## 2023-09-06 ENCOUNTER — Other Ambulatory Visit (INDEPENDENT_AMBULATORY_CARE_PROVIDER_SITE_OTHER): Payer: Medicare Other

## 2023-09-06 DIAGNOSIS — M4712 Other spondylosis with myelopathy, cervical region: Secondary | ICD-10-CM | POA: Diagnosis not present

## 2023-09-06 DIAGNOSIS — M542 Cervicalgia: Secondary | ICD-10-CM

## 2023-09-06 NOTE — Progress Notes (Signed)
Orthopedic Spine Surgery Office Note  Assessment: Patient is a 77 y.o. female with gradually worsening fine motor skills with the hands, numbness, and burning pain. Also reporting imbalance. Has bilateral hoffman sign. Concern for cervical myelopathy    Plan: -Ordered a CT myelogram to evaluate for myelopathy -Patient has tried tylenol, aleve, gabapentin -Instructed her to increase her dose of gabapentin to 200mg  TID since she is not getting any relief with the 1000mg  dose -Patient should return to office in 4 weeks, x-rays at next visit: none   Patient expressed understanding of the plan and all questions were answered to the patient's satisfaction.   ___________________________________________________________________________   History:  Patient is a 77 y.o. female who presents today for cervical spine.  Patient has noticed for the last several months worsening function of her hands.  She has noticed numbness and a burning pain in the hands.  She has had trouble with buttoning shirts and has had difficulty with texting.  She said she periodically will drop objects as well.  She has some neck pain in the lower cervical spine.  No pain radiating into either upper extremity.  She also has noticed issues with imbalance.  She has not needed any ambulatory assistive devices but she does use a handrail when going up the stairs because she has a fear of falling.    Weakness: denies Difficulty with fine motor skills (e.g., buttoning shirts, handwriting): yes Symptoms of imbalance: yes Paresthesias and numbness: yes bilateral hands feel numb Bowel or bladder incontinence: denies Saddle anesthesia: denies  Treatments tried: tylenol, aleve, gabapentin  Review of systems: Denies fevers and chills, night sweats, unexplained weight loss, history of cancer. Has had pain that wakes her at night  Past medical history: HLD HTN GERD  Allergies: Codeine, penicillin, phenytoin, sulfonamide  Past  surgical history:  Hysterectomy Polypectomy Nasal sinus surgery  Social history: Denies use of nicotine product (smoking, vaping, patches, smokeless) Alcohol use: denies Denies recreational drug use  Physical Exam:  BMI of 26.4  General: no acute distress, appears stated age Neurologic: alert, answering questions appropriately, following commands Respiratory: unlabored breathing on room air, symmetric chest rise Psychiatric: appropriate affect, normal cadence to speech   MSK (spine):  -Strength exam      Left  Right Grip strength                5/5  5/5 Interosseus   5/5   5/5 Wrist extension  5/5  5/5 Wrist flexion   5/5  5/5 Elbow flexion   5/5  5/5 Deltoid    5/5  5/5  EHL    5/5  5/5 TA    5/5  5/5 GSC    5/5  5/5 Knee extension  5/5  5/5 Hip flexion   5/5  5/5  -Sensory exam    Sensation intact to light touch in L3-S1 nerve distributions of bilateral lower extremities  Sensation intact to light touch in C5-T1 nerve distributions of bilateral upper extremities (decreased in bilateral hands)  -Brachioradialis DTR: 2/4 on the left, 2/4 on the right -Biceps DTR: 2/4 on the left, 2/4 on the right -Triceps DTR: 2/4 on the left, 2/4 on the right -Achilles DTR: 2/4 on the left, 2/4 on the right -Patellar tendon DTR: 2/4 on the left, 2/4 on the right  -Spurling: negative bilaterally -Hoffman sign: positive bilaterally -Clonus: no beats bilaterally -Interosseous wasting: none seen -Grip and release test: positive -Romberg: negative -Gait: normal -Imbalance with tandem gait: yes  Left shoulder exam: no ain through range of motion Right shoulder exam: no pain through range of motion  Tinel's at wrist: negative bilaterally Phalen's at wrist: negative bilaterally Durkan's: negative bilaterally  Tinel's at elbow: negative bilaterally  Imaging: XRs of the cervical spine from 09/06/2023 was independently reviewed and interpreted, showing disc height loss at  C5/6 and C6/7. C6/7 facet arthropathy. No fracture or dislocation seen. No evidence of instability on flexion/extension.   CT of the cervical spine from 04/02/2053 was independently reviewed and interpreted, showing disc height loss at C5/6 and C6/7. Facet arthropathy at C6/7. No fracture or dislocation seen.    Patient name: Whitney Patterson Patient MRN: [id number] Date of visit: 09/06/23

## 2023-09-07 DIAGNOSIS — I10 Essential (primary) hypertension: Secondary | ICD-10-CM | POA: Diagnosis not present

## 2023-09-07 DIAGNOSIS — E785 Hyperlipidemia, unspecified: Secondary | ICD-10-CM | POA: Diagnosis not present

## 2023-09-18 ENCOUNTER — Other Ambulatory Visit: Payer: Self-pay | Admitting: Orthopedic Surgery

## 2023-09-18 DIAGNOSIS — M4712 Other spondylosis with myelopathy, cervical region: Secondary | ICD-10-CM

## 2023-09-24 NOTE — Discharge Instructions (Signed)

## 2023-09-27 ENCOUNTER — Ambulatory Visit
Admission: RE | Admit: 2023-09-27 | Discharge: 2023-09-27 | Disposition: A | Discharge status: Home / Self Care | Payer: Medicare Other | Source: Ambulatory Visit | Attending: Orthopedic Surgery | Admitting: Orthopedic Surgery

## 2023-09-27 ENCOUNTER — Inpatient Hospital Stay
Admission: RE | Admit: 2023-09-27 | Discharge: 2023-09-27 | Disposition: A | Discharge status: Home / Self Care | Payer: Medicare Other | Source: Ambulatory Visit | Attending: Orthopedic Surgery

## 2023-09-27 DIAGNOSIS — M50322 Other cervical disc degeneration at C5-C6 level: Secondary | ICD-10-CM | POA: Diagnosis not present

## 2023-09-27 DIAGNOSIS — M4712 Other spondylosis with myelopathy, cervical region: Secondary | ICD-10-CM

## 2023-09-27 DIAGNOSIS — M5031 Other cervical disc degeneration,  high cervical region: Secondary | ICD-10-CM | POA: Diagnosis not present

## 2023-09-27 DIAGNOSIS — M47813 Spondylosis without myelopathy or radiculopathy, cervicothoracic region: Secondary | ICD-10-CM | POA: Diagnosis not present

## 2023-09-27 MED ORDER — DIAZEPAM 5 MG PO TABS: 5.0000 mg | ORAL_TABLET | Freq: Once | ORAL | Status: DC

## 2023-09-27 MED ORDER — ONDANSETRON HCL 4 MG/2ML IJ SOLN: 4.0000 mg | Freq: Once | INTRAMUSCULAR | Status: DC | PRN

## 2023-09-27 MED ORDER — IOPAMIDOL (ISOVUE-M 300) INJECTION 61%
10.0000 mL | Freq: Once | INTRAMUSCULAR | Status: AC | PRN
  Administered 2023-09-27: 10 mL via INTRATHECAL

## 2023-09-27 MED ORDER — MEPERIDINE HCL 50 MG/ML IJ SOLN: 50.0000 mg | Freq: Once | INTRAMUSCULAR | Status: DC | PRN

## 2023-10-04 ENCOUNTER — Ambulatory Visit: Payer: Medicare Other | Admitting: Orthopedic Surgery

## 2023-10-08 DIAGNOSIS — E785 Hyperlipidemia, unspecified: Secondary | ICD-10-CM | POA: Diagnosis not present

## 2023-10-08 DIAGNOSIS — I10 Essential (primary) hypertension: Secondary | ICD-10-CM | POA: Diagnosis not present

## 2023-10-13 ENCOUNTER — Ambulatory Visit: Payer: Medicare Other | Admitting: Orthopedic Surgery

## 2023-10-13 DIAGNOSIS — G5601 Carpal tunnel syndrome, right upper limb: Secondary | ICD-10-CM | POA: Diagnosis not present

## 2023-10-13 DIAGNOSIS — G5602 Carpal tunnel syndrome, left upper limb: Secondary | ICD-10-CM

## 2023-10-13 NOTE — Progress Notes (Signed)
 Orthopedic Spine Surgery Office Note   Assessment: Patient is a 78 y.o. female with gradually worsening fine motor skills with the hands, numbness, and burning pain.  No evidence of central stenosis on her cervical myelogram     Plan: -I went over her CT myelogram with her.  I explained that there is no central stenosis to explain her decreased hand function.  She has numbness that is affecting her ability to form perform fine motor skills.  She has seen Dr. Phyllis Breeze about carpal tunnel syndrome and possible release.  At this point, she does not need any cervical spine surgery. I told her that if she wants to proceed with carpal tunnel release with Dr. Phyllis Breeze that is fine.  She does not have myelopathy that needs to be addressed first -Patient should return to office on an as-needed basis     Patient expressed understanding of the plan and all questions were answered to the patient's satisfaction.    ___________________________________________________________________________     History:   Patient is a 78 y.o. female who presents today for follow up on her  cervical spine.  Patient has not noticed any changes in her symptoms since she was last seen.  She is still having numbness and tingling and burning pain in her hands.  She has had difficulty with fine motor skills in the hand as a result of the decrease sensation.  She says she is a little unsteady on her feet but nothing significant.  She is not using any ambulatory assistive devices.   Treatments tried: tylenol , aleve , gabapentin     Physical Exam:   General: no acute distress, appears stated age Neurologic: alert, answering questions appropriately, following commands Respiratory: unlabored breathing on room air, symmetric chest rise Psychiatric: appropriate affect, normal cadence to speech     MSK (spine):   -Strength exam                                                   Left                  Right Grip strength                 5/5                  5/5 Interosseus                  5/5                  5/5 Wrist extension            5/5                  5/5 Wrist flexion                 5/5                  5/5 Elbow flexion                5/5                  5/5 Deltoid                          5/5  5/5   -Sensory exam                           Sensation intact to light touch in C5-T1 nerve distributions of bilateral upper extremities (decreased in bilateral hands)   -Brachioradialis DTR: 2/4 on the left, 2/4 on the right -Biceps DTR: 2/4 on the left, 2/4 on the right -Triceps DTR: 2/4 on the left, 2/4 on the right    Imaging: XRs of the cervical spine from 09/06/2023 were previously independently reviewed and interpreted, showing disc height loss at C5/6 and C6/7. C6/7 facet arthropathy. No fracture or dislocation seen. No evidence of instability on flexion/extension.    CT myelogram of the cervical spine from 09/27/2023 was independently reviewed and interpreted, showing foraminal stenosis at C6/7 (L>R).  No significant central stenosis seen.     Patient name: Whitney Patterson Patient MRN: [id number] Date of visit: 10/13/23

## 2023-10-14 ENCOUNTER — Encounter: Payer: Self-pay | Admitting: Gastroenterology

## 2023-10-14 ENCOUNTER — Ambulatory Visit: Payer: Medicare Other | Admitting: Gastroenterology

## 2023-10-14 VITALS — BP 156/75 | HR 64 | Temp 97.8°F | Ht 61.0 in | Wt 143.2 lb

## 2023-10-14 DIAGNOSIS — K5900 Constipation, unspecified: Secondary | ICD-10-CM

## 2023-10-14 DIAGNOSIS — D509 Iron deficiency anemia, unspecified: Secondary | ICD-10-CM

## 2023-10-14 DIAGNOSIS — K219 Gastro-esophageal reflux disease without esophagitis: Secondary | ICD-10-CM

## 2023-10-14 DIAGNOSIS — K5904 Chronic idiopathic constipation: Secondary | ICD-10-CM

## 2023-10-14 NOTE — Patient Instructions (Addendum)
I have ordered labs for you to have performed at [facility].  They are located at [facility] on the second floor across from [facility].  Continue taking your omeprazole 20 mg once daily.  Continue [employer]' magnesium as needed however I would recommend a more long-term solution to your constipation with MiraLAX 17 g nightly.  You can try taking this on a night that you do not have to be anywhere very early the next morning to help determine timing.  It may take several days before you see some results from this.  Continue taking your iron daily and ensure you are drinking at least 4 to 6 glasses of water daily.  This can help with your constipation.  Follow-up in 6 months, sooner if needed pending your blood work.  It was a pleasure to see you today. I want to create trusting relationships with patients. If you receive a survey regarding your visit,  I greatly appreciate you taking time to fill this out on paper or through your MyChart. I value your feedback.  Brooke Bonito, MSN, FNP-BC, AGACNP-BC [facility]

## 2023-10-14 NOTE — Progress Notes (Signed)
GI Office Note    Referring Provider: Benetta Spar* Primary Care Physician:  Benetta Spar, MD Primary Gastroenterologist: Hennie Duos. Marletta Lor, DO  Date:  10/14/2023  ID:  Alroy Bailiff, DOB March 22, 1946, MRN [id number]   Chief Complaint   Chief Complaint  Patient presents with   Follow-up    Doing well, no issues   History of Present Illness  Blossie A Loft is a 78 y.o. female with a history of GERD, constipation, HLD, anemia, and arthritis presenting today for follow-up of anemia.  Colonoscopy July 2013  - internal hemorrhoids -moderate left-sided diverticulosis -Recommended repeat in 10 years. (Tri-lyte prep)   After being seen in July 2023 shows unable to keep down bowel prep for colonoscopy therefore repeat office visit recommended to regroup (was given Suprep)  OV 06/18/22.  She reported sudden inability to keep prep down.  Denied any prior nausea and also any abdominal pain.  Reported issues with constipation and taking Vear Clock' magnesium once a week with only a bowel movement once per week.  May have some additional stools with certain foods but otherwise goes once weekly.  Reportedly only taking iron pills and no extra fiber supplementation.  Stools are soft but having to strain.  Reported bowel movement the next day after taking MiraLAX and she states she takes this when she can.  Takes omeprazole every morning for reflux and is well-controlled.  Advised to reschedule colonoscopy and recommended MiraLAX daily as well as continuing Vear Clock' magnesium.  Advised TriLyte prep and Linzess prior to procedure.  Colonoscopy 07/15/2022: -Hemorrhoids on perianal exam -Sigmoid diverticulosis -4 mm sigmoid polyp (tubular adenoma) -No repeat colonoscopy due to age  Last office visit 07/15/23 for constipation.  She reported her reflux was well-controlled with omeprazole and denied any dysphagia or odynophagia.  Constipation well-controlled with as needed milk  of magnesia which she takes 1-2 times per week.  Increased her water intake and this has helped symptoms.  For IDA her labs were reviewed and her recent hemoglobin was 10.1 and taking oral iron.  Denied any hematochezia but is having dark stools related to iron.  Given her hemoglobin was 10.1 she was recommended to follow-up in 3 months and obtain CBC and iron studies and if no improvement advised we need to consider further evaluation with EGD and/or capsule.     Latest Ref Rng & Units 04/03/2023    9:16 AM 10/09/2021    7:45 AM 07/03/2019    1:46 PM  CBC  WBC 4.0 - 10.5 K/uL 10.8  9.3  7.0   Hemoglobin 12.0 - 15.0 g/dL 03.4  74.2  59.5   Hematocrit 36.0 - 46.0 % 32.1  36.5  39.8   Platelets 150 - 400 K/uL 473  347  310     Today:  Husband has been in therapy and she is going back and forth to be there for him.   Iron is going fairly well however it does constipate her. She is taking phillips magnesia at least once per week given how often she is traveling. She was previously taking it twice per week. She is having BMs about once per week , sometimes twice. Has had increased gas with this. Denies abdominal pain Timing of taking a laxative is hard for her.   Taking iron daily - energy is pretty good. Appetite is good. No unintentional weight loss. Has dark stool from the iron but no overt melena or brbpr. No chest pain or shortness of breath.  GERD is controlled on her omeprazole 20 mg once daily.   Wt Readings from Last 3 Encounters:  10/14/23 143 lb 3.2 oz (65 kg)  07/16/23 140 lb (63.5 kg)  07/15/23 143 lb 9.6 oz (65.1 kg)    Current Outpatient Medications  Medication Sig Dispense Refill   acetaminophen (TYLENOL) 500 MG tablet Take 1 tablet (500 mg total) by mouth every 6 (six) hours as needed. 30 tablet 0   alendronate (FOSAMAX) 70 MG tablet Take 70 mg by mouth every Monday. In the morning     amLODipine (NORVASC) 10 MG tablet Take 10 mg by mouth daily.     Calcium  Carb-Cholecalciferol (CALCIUM 600+D3 PO) Take 1 tablet by mouth in the morning.     ferrous sulfate 325 (65 FE) MG tablet Take 325 mg by mouth 2 (two) times daily with a meal.     gabapentin (NEURONTIN) 100 MG capsule TAKE 1 CAPSULE BY MOUTH THREE TIMES DAILY 90 capsule 0   lovastatin (MEVACOR) 20 MG tablet Take 20 mg by mouth every morning.      meloxicam (MOBIC) 7.5 MG tablet Take 1 tablet (7.5 mg total) by mouth daily. 30 tablet 5   mirtazapine (REMERON) 15 MG tablet Take 15 mg by mouth at bedtime.     omeprazole (PRILOSEC) 20 MG capsule Take 20 mg by mouth every morning.      Polyethyl Glycol-Propyl Glycol (SYSTANE) 0.4-0.3 % SOLN Place 1 drop into both eyes in the morning and at bedtime.     No current facility-administered medications for this visit.    Past Medical History:  Diagnosis Date   Arthritis    Constipation    Gastroesophageal reflux disease    Heart burn    High cholesterol    History of anemia    HTN (hypertension)    Osteoporosis    Sigmoid diverticulosis     Past Surgical History:  Procedure Laterality Date   COLONOSCOPY  09/15/2005   Few tiny diverticula at sigmoid colon, otherwise normal colonoscopy.   COLONOSCOPY WITH PROPOFOL N/A 07/15/2022   Procedure: COLONOSCOPY WITH PROPOFOL;  Surgeon: Lanelle Bal, DO;  [facility];  Service: Endoscopy;  Laterality: N/A;  8:30 am   ESOPHAGOGASTRODUODENOSCOPY  09/15/2005   Normal examination of esophagus. Esophagus dilated by passing 54-French  Maloney dilator given history of dysphagia but without mucosal disruption/ Nonerosive antral gastritis   FOOT SURGERY     NASAL SINUS SURGERY      A BULLET REMOVED FROM HER RIGHT FRONTAL SINUS 20 + YEARS AGO    PARTIAL HYSTERECTOMY     POLYPECTOMY  07/15/2022   Procedure: POLYPECTOMY;  Surgeon: Lanelle Bal, DO;  [facility]: [facility];  Service: Endoscopy;;   STOMACH SURGERY      Family History  Problem Relation Age of Onset   Heart disease Other     Arthritis Other    Cancer Other    Diabetes Other    Colon cancer Father 53    Allergies as of 10/14/2023 - Review Complete 10/14/2023  Allergen Reaction Noted   Codeine Other (See Comments)    Penicillins Swelling    Phenytoin Hives    Sulfonamide derivatives Other (See Comments)     Social History   Socioeconomic History   Marital status: Married    Spouse name: Not on file   Number of children: 0   Years of education: 8th grade   Highest education level: Not on file  Occupational History  Occupation: Education officer, community: [employer]  Tobacco Use   Smoking status: Never   Smokeless tobacco: Never  Vaping Use   Vaping status: Never Used  Substance and Sexual Activity   Alcohol use: No    Comment: heavy etoh x47yrs, quit 1991   Drug use: No   Sexual activity: Not Currently  Other Topics Concern   Not on file  Social History Narrative   Not on file   Social Drivers of Health   Financial Resource Strain: Not on file  Food Insecurity: Not on file  Transportation Needs: Not on file  Physical Activity: Not on file  Stress: Not on file  Social Connections: Not on file     Review of Systems   Gen: Denies fever, chills, anorexia. Denies fatigue, weakness, weight loss.  CV: Denies chest pain, palpitations, syncope, peripheral edema, and claudication. Resp: Denies dyspnea at rest, cough, wheezing, coughing up blood, and pleurisy. GI: See HPI Derm: Denies rash, itching, dry skin Psych: Denies depression, anxiety, memory loss, confusion. No homicidal or suicidal ideation.  Heme: Denies bruising, bleeding, and enlarged lymph nodes.  Physical Exam   BP (!) 156/75 (BP Location: Left Arm, Patient Position: Sitting, Cuff Size: Normal)   Pulse 64   Temp 97.8 F (36.6 C) (Oral)   Ht 5\' 1"  (1.549 m)   Wt 143 lb 3.2 oz (65 kg)   SpO2 96%   BMI 27.06 kg/m   General:   Alert and oriented. No distress noted. Pleasant and cooperative.  Head:  Normocephalic and  atraumatic. Eyes:  Conjuctiva clear without scleral icterus. Mouth:  Oral mucosa pink and moist. Good dentition. No lesions. Lungs:  Clear to auscultation bilaterally. No wheezes, rales, or rhonchi. No distress.  Heart:  S1, S2 present without murmurs appreciated.  Abdomen:  +BS, soft, non-tender and non-distended. No rebound or guarding. No HSM or masses noted. Rectal: deferred Msk:  Symmetrical without gross deformities. Normal posture. Extremities:  Without edema. Neurologic:  Alert and  oriented x4 Psych:  Alert and cooperative. Normal mood and affect.  Assessment  Lauraann A Kapoor is a 78 y.o. female with a history of GERD, constipation, HLD, anemia, and arthritis presenting today for follow-up of anemia.  IDA: Last hemoglobin 10.1 in July 2024.  This has been downtrending since January 2023 when it was 11.4.  She denies any melena, BRBPR, lightheadedness, dizziness, syncope.  She has noted chronic anemia for which she takes iron daily.  Colonoscopy in October 2023 with sigmoid diverticulosis and 1 single tubular adenoma with no recommended repeat due to age.  Overall feeling well with good energy and good appetite.  Will reassess her hemoglobin and iron studies and if hemoglobin has dropped further we discussed potentially pursuing an upper endoscopy for further evaluation.  GERD: Well-controlled with omeprazole 20 mg once a day.  Denies any nausea, vomiting, dysphagia, or odynophagia.  Constipation: Not currently well-controlled although denies any abdominal pain previously was going at least twice per week when taking Vear Clock' magnesium however given her current need to travel to see her husband who is in rehab she has been taking this less frequently given fear of having an accident or urgency when being far away from home.  Has had some increased gassiness as well which I presume is related to constipation.  We discussed potential MiraLAX nightly to avoid potential frequent bowel  movements throughout the day.  We did discuss that consistency with MiraLAX is key and that she needs to remain  hydrated.  PLAN   CBC and iron studies Consider EGD and/or capsule if Hgb <10 Continue daily iron therapy Continue milk of magnesia as needed Consider miralax 17g once nightly.  Ensure adequate hydration Continue omeprazole 20 mg once daily Follow up TBD after receiving lab results   Brooke Bonito, MSN, FNP-BC, AGACNP-BC [facility]

## 2023-10-15 DIAGNOSIS — D509 Iron deficiency anemia, unspecified: Secondary | ICD-10-CM | POA: Diagnosis not present

## 2023-10-16 LAB — CBC
HCT: 37.3 % (ref 35.0–45.0)
Hemoglobin: 12.3 g/dL (ref 11.7–15.5)
MCH: 28.1 pg (ref 27.0–33.0)
MCHC: 33 g/dL (ref 32.0–36.0)
MCV: 85.4 fL (ref 80.0–100.0)
MPV: 10.6 fL (ref 7.5–12.5)
Platelets: 351 10*3/uL (ref 140–400)
RBC: 4.37 10*6/uL (ref 3.80–5.10)
RDW: 13.9 % (ref 11.0–15.0)
WBC: 6.6 10*3/uL (ref 3.8–10.8)

## 2023-10-16 LAB — IRON,TIBC AND FERRITIN PANEL
%SAT: 38 % (ref 16–45)
Ferritin: 207 ng/mL (ref 16–288)
Iron: 105 ug/dL (ref 45–160)
TIBC: 278 ug/dL (ref 250–450)

## 2023-11-02 DIAGNOSIS — I10 Essential (primary) hypertension: Secondary | ICD-10-CM | POA: Diagnosis not present

## 2023-11-02 DIAGNOSIS — K219 Gastro-esophageal reflux disease without esophagitis: Secondary | ICD-10-CM | POA: Diagnosis not present

## 2023-11-02 DIAGNOSIS — Z1389 Encounter for screening for other disorder: Secondary | ICD-10-CM | POA: Diagnosis not present

## 2023-11-02 DIAGNOSIS — M810 Age-related osteoporosis without current pathological fracture: Secondary | ICD-10-CM | POA: Diagnosis not present

## 2023-11-02 DIAGNOSIS — Z0001 Encounter for general adult medical examination with abnormal findings: Secondary | ICD-10-CM | POA: Diagnosis not present

## 2023-11-02 DIAGNOSIS — E785 Hyperlipidemia, unspecified: Secondary | ICD-10-CM | POA: Diagnosis not present

## 2023-11-03 ENCOUNTER — Telehealth: Payer: Self-pay | Admitting: Internal Medicine

## 2023-11-03 NOTE — Telephone Encounter (Signed)
 Patient was seen on 10/14/2023 by Charmaine Melia, NP and today received referral from Dr Alexa office that she needed a colonoscopy. The referral didn't include her last office note or any recent labs/imaging results. Im still waiting on those. Does patient need another OV (she's 77) or can she be scheduled a colonoscopy? [id number]

## 2023-11-13 ENCOUNTER — Other Ambulatory Visit: Payer: Self-pay | Admitting: Orthopedic Surgery

## 2023-11-13 DIAGNOSIS — M542 Cervicalgia: Secondary | ICD-10-CM

## 2023-11-13 DIAGNOSIS — M4712 Other spondylosis with myelopathy, cervical region: Secondary | ICD-10-CM

## 2023-11-30 DIAGNOSIS — I10 Essential (primary) hypertension: Secondary | ICD-10-CM | POA: Diagnosis not present

## 2023-11-30 DIAGNOSIS — E785 Hyperlipidemia, unspecified: Secondary | ICD-10-CM | POA: Diagnosis not present

## 2023-12-21 ENCOUNTER — Other Ambulatory Visit: Payer: Self-pay | Admitting: Orthopedic Surgery

## 2023-12-21 DIAGNOSIS — M4712 Other spondylosis with myelopathy, cervical region: Secondary | ICD-10-CM

## 2023-12-21 DIAGNOSIS — M542 Cervicalgia: Secondary | ICD-10-CM

## 2023-12-31 DIAGNOSIS — E785 Hyperlipidemia, unspecified: Secondary | ICD-10-CM | POA: Diagnosis not present

## 2023-12-31 DIAGNOSIS — I10 Essential (primary) hypertension: Secondary | ICD-10-CM | POA: Diagnosis not present

## 2024-01-07 ENCOUNTER — Other Ambulatory Visit: Payer: Self-pay | Admitting: Orthopedic Surgery

## 2024-01-07 DIAGNOSIS — M4712 Other spondylosis with myelopathy, cervical region: Secondary | ICD-10-CM

## 2024-01-07 DIAGNOSIS — M542 Cervicalgia: Secondary | ICD-10-CM

## 2024-01-30 DIAGNOSIS — I10 Essential (primary) hypertension: Secondary | ICD-10-CM | POA: Diagnosis not present

## 2024-01-30 DIAGNOSIS — E785 Hyperlipidemia, unspecified: Secondary | ICD-10-CM | POA: Diagnosis not present

## 2024-01-31 ENCOUNTER — Other Ambulatory Visit: Payer: Self-pay | Admitting: Orthopedic Surgery

## 2024-01-31 DIAGNOSIS — M4712 Other spondylosis with myelopathy, cervical region: Secondary | ICD-10-CM

## 2024-01-31 DIAGNOSIS — M542 Cervicalgia: Secondary | ICD-10-CM

## 2024-02-19 ENCOUNTER — Other Ambulatory Visit: Payer: Self-pay | Admitting: Orthopedic Surgery

## 2024-02-19 DIAGNOSIS — M4712 Other spondylosis with myelopathy, cervical region: Secondary | ICD-10-CM

## 2024-03-01 DIAGNOSIS — E785 Hyperlipidemia, unspecified: Secondary | ICD-10-CM | POA: Diagnosis not present

## 2024-03-01 DIAGNOSIS — I10 Essential (primary) hypertension: Secondary | ICD-10-CM | POA: Diagnosis not present

## 2024-03-31 DIAGNOSIS — I10 Essential (primary) hypertension: Secondary | ICD-10-CM | POA: Diagnosis not present

## 2024-03-31 DIAGNOSIS — E785 Hyperlipidemia, unspecified: Secondary | ICD-10-CM | POA: Diagnosis not present

## 2024-04-05 ENCOUNTER — Other Ambulatory Visit: Payer: Self-pay | Admitting: Orthopedic Surgery

## 2024-04-05 DIAGNOSIS — M4712 Other spondylosis with myelopathy, cervical region: Secondary | ICD-10-CM

## 2024-05-01 DIAGNOSIS — M810 Age-related osteoporosis without current pathological fracture: Secondary | ICD-10-CM | POA: Diagnosis not present

## 2024-05-01 DIAGNOSIS — M170 Bilateral primary osteoarthritis of knee: Secondary | ICD-10-CM | POA: Diagnosis not present

## 2024-05-01 DIAGNOSIS — I10 Essential (primary) hypertension: Secondary | ICD-10-CM | POA: Diagnosis not present

## 2024-05-01 DIAGNOSIS — K219 Gastro-esophageal reflux disease without esophagitis: Secondary | ICD-10-CM | POA: Diagnosis not present

## 2024-05-01 DIAGNOSIS — D509 Iron deficiency anemia, unspecified: Secondary | ICD-10-CM | POA: Diagnosis not present

## 2024-05-18 ENCOUNTER — Other Ambulatory Visit (HOSPITAL_COMMUNITY): Payer: Self-pay | Admitting: Internal Medicine

## 2024-05-18 DIAGNOSIS — Z1231 Encounter for screening mammogram for malignant neoplasm of breast: Secondary | ICD-10-CM

## 2024-06-01 DIAGNOSIS — E785 Hyperlipidemia, unspecified: Secondary | ICD-10-CM | POA: Diagnosis not present

## 2024-06-01 DIAGNOSIS — I10 Essential (primary) hypertension: Secondary | ICD-10-CM | POA: Diagnosis not present

## 2024-06-26 ENCOUNTER — Ambulatory Visit (HOSPITAL_COMMUNITY)
Admission: RE | Admit: 2024-06-26 | Discharge: 2024-06-26 | Disposition: A | Discharge status: Home / Self Care | Source: Ambulatory Visit | Attending: Internal Medicine | Admitting: Internal Medicine

## 2024-06-26 DIAGNOSIS — Z1231 Encounter for screening mammogram for malignant neoplasm of breast: Secondary | ICD-10-CM | POA: Diagnosis not present

## 2024-07-01 DIAGNOSIS — E785 Hyperlipidemia, unspecified: Secondary | ICD-10-CM | POA: Diagnosis not present

## 2024-07-01 DIAGNOSIS — I10 Essential (primary) hypertension: Secondary | ICD-10-CM | POA: Diagnosis not present

## 2024-08-29 ENCOUNTER — Encounter: Payer: Self-pay | Admitting: Gastroenterology

## 2024-10-24 ENCOUNTER — Encounter: Payer: Self-pay | Admitting: Gastroenterology

## 2024-10-24 ENCOUNTER — Ambulatory Visit: Admitting: Gastroenterology

## 2024-10-24 VITALS — BP 138/81 | HR 66 | Temp 97.9°F | Ht 61.0 in | Wt 160.0 lb

## 2024-10-24 DIAGNOSIS — K5909 Other constipation: Secondary | ICD-10-CM

## 2024-10-24 DIAGNOSIS — K5904 Chronic idiopathic constipation: Secondary | ICD-10-CM

## 2024-10-24 DIAGNOSIS — K219 Gastro-esophageal reflux disease without esophagitis: Secondary | ICD-10-CM

## 2024-10-24 DIAGNOSIS — D509 Iron deficiency anemia, unspecified: Secondary | ICD-10-CM | POA: Diagnosis not present

## 2024-10-24 NOTE — Progress Notes (Signed)
 "  GI Office Note    Referring Provider: Carlette Benita Area, MD Primary Care Physician:  Fanta, Tesfaye Demissie, MD Primary Gastroenterologist: Carlin POUR. Cindie, DO  Date:  10/24/2024  ID:  Whitney Patterson, DOB 1946-07-03, MRN [id number]   Chief Complaint   Chief Complaint  Patient presents with   Follow-up    Follow up. No problems    History of Present Illness  Whitney Patterson is a 79 y.o. female with a history of GERD, constipation, HLD, anemia, and arthritis presenting today for annual follow-up with no complaints.  Colonoscopy July 2013  - internal hemorrhoids -moderate left-sided diverticulosis -Recommended repeat in 10 years. (Tri-lyte prep)    OV 06/18/22.  She reported sudden inability to keep prep down.  Denied any prior nausea and also any abdominal pain.  Reported issues with constipation and taking Orlando' magnesium once a week with only a bowel movement once per week.  May have some additional stools with certain foods but otherwise goes once weekly.  Reportedly only taking iron pills and no extra fiber supplementation.  Stools are soft but having to strain.  Reported bowel movement the next day after taking MiraLAX and she states she takes this when she can.  Takes omeprazole every morning for reflux and is well-controlled.  Advised to reschedule colonoscopy and recommended MiraLAX daily as well as continuing Orlando' magnesium.  Advised TriLyte prep and Linzess prior to procedure.   Colonoscopy 07/15/2022: -Hemorrhoids on perianal exam -Sigmoid diverticulosis -4 mm sigmoid polyp (tubular adenoma) -No repeat colonoscopy due to age   OV 07/15/23 for constipation.  She reported her reflux was well-controlled with omeprazole and denied any dysphagia or odynophagia.  Constipation well-controlled with as needed milk of magnesia which she takes 1-2 times per week.  Increased her water  intake and this has helped symptoms.  For IDA her labs were reviewed and her  recent hemoglobin was 10.1 and taking oral iron.  Denied any hematochezia but is having dark stools related to iron.  Given her hemoglobin was 10.1 she was recommended to follow-up in 3 months and obtain CBC and iron studies and if no improvement advised we need to consider further evaluation with EGD and/or capsule.  Last office visit January 2025.  Had been taking iron though did note some constipation.  Taking Orlando' magnesia once per week.  Previously was taking it twice per week.  Having bowel movements about once a week, sometimes twice with some increased gas.  Denies any abdominal pain.  Timing of taking laxative have been hard for for her given her travel back and forth for her husband.  Reported good appetite and good energy without weight loss.  Has some dark stool from iron but no overt melena or BRBPR.  GERD controlled with omeprazole 20 mg once daily.  No chest pain or shortness of breath.  Advised to check CBC and iron studies, consider EGD plus or minus capsule if her hemoglobin was less than 10.  Advise continue daily iron therapy and consider MiraLAX nightly and advised to increase her hydration.  Continue omeprazole 20 mg daily.     Latest Ref Rng & Units 10/15/2023    7:53 AM 04/03/2023    9:16 AM 10/09/2021    7:45 AM  CBC  WBC 3.8 - 10.8 Thousand/uL 6.6  10.8  9.3   Hemoglobin 11.7 - 15.5 g/dL 87.6  89.8  88.5   Hematocrit 35.0 - 45.0 % 37.3  32.1  36.5   Platelets  140 - 400 Thousand/uL 351  473  347     Today:   Discussed the use of AI scribe software for clinical note transcription with the patient, who gave verbal consent to proceed.  Constipation has been longstanding and worsened since starting ferrous sulfate, which she takes three tablets daily. Bowel movements are infrequent and difficult, with dark, hard stools. She describes herself as 'a lot constipated.' Orlando (magnesium) is used once weekly to induce bowel movements, and Dulcolax is effective but causes  cramping. Miralax was taken earlier this week. Eating ice cream sometimes helps, but not reliably.  No red blood in stool. No nausea, vomiting, or dysphagia. Appetite is good and she is eating regularly. No significant weight loss.  Shortness of breath occurs with exertion, such as walking to the mailbox which is a longer distance for her, but not at rest or with short distances. No chest pain.  History of chronic gastroesophageal reflux disease; patient reports taking omeprazole 20 mg daily and does not report significant breakthrough symptoms.  Previously took mirtazapine for appetite loss following her husband's death in 2025-03-25, but discontinued after appetite returned. Denies feeling depressed or upset since her husband's passing.      Wt Readings from Last 6 Encounters:  10/24/24 160 lb (72.6 kg)  10/14/23 143 lb 3.2 oz (65 kg)  07/16/23 140 lb (63.5 kg)  07/15/23 143 lb 9.6 oz (65.1 kg)  05/21/23 140 lb (63.5 kg)  04/29/23 140 lb (63.5 kg)    Body mass index is 30.23 kg/m.  Current Outpatient Medications  Medication Sig Dispense Refill   acetaminophen  (TYLENOL ) 500 MG tablet Take 1 tablet (500 mg total) by mouth every 6 (six) hours as needed. 30 tablet 0   alendronate (FOSAMAX) 70 MG tablet Take 70 mg by mouth every Monday. In the morning     amLODipine  (NORVASC ) 10 MG tablet Take 10 mg by mouth daily.     Calcium Carb-Cholecalciferol (CALCIUM 600+D3 PO) Take 1 tablet by mouth in the morning.     ferrous sulfate 325 (65 FE) MG tablet Take 325 mg by mouth 2 (two) times daily with a meal.     gabapentin  (NEURONTIN ) 100 MG capsule TAKE 1 CAPSULE BY MOUTH THREE TIMES DAILY 90 capsule 5   lovastatin (MEVACOR) 20 MG tablet Take 20 mg by mouth every morning.      meloxicam  (MOBIC ) 7.5 MG tablet Take 1 tablet by mouth once daily 90 tablet 5   omeprazole (PRILOSEC) 20 MG capsule Take 20 mg by mouth every morning.      Polyethyl Glycol-Propyl Glycol (SYSTANE) 0.4-0.3 % SOLN Place 1 drop  into both eyes in the morning and at bedtime.     mirtazapine (REMERON) 15 MG tablet Take 15 mg by mouth at bedtime. (Patient not taking: Reported on 10/24/2024)     No current facility-administered medications for this visit.    Past Medical History:  Diagnosis Date   Arthritis    Constipation    Gastroesophageal reflux disease    Heart burn    High cholesterol    History of anemia    HTN (hypertension)    Osteoporosis    Sigmoid diverticulosis     Past Surgical History:  Procedure Laterality Date   COLONOSCOPY  09/15/2005   Few tiny diverticula at sigmoid colon, otherwise normal colonoscopy.   COLONOSCOPY WITH PROPOFOL  N/A 07/15/2022   Procedure: COLONOSCOPY WITH PROPOFOL ;  Surgeon: Cindie Carlin POUR, DO;  [facility]: [facility];  Service:  Endoscopy;  Laterality: N/A;  8:30 am   ESOPHAGOGASTRODUODENOSCOPY  09/15/2005   Normal examination of esophagus. Esophagus dilated by passing 54-French  Maloney dilator given history of dysphagia but without mucosal disruption/ Nonerosive antral gastritis   FOOT SURGERY     NASAL SINUS SURGERY      A BULLET REMOVED FROM HER RIGHT FRONTAL SINUS 20 + YEARS AGO    PARTIAL HYSTERECTOMY     POLYPECTOMY  07/15/2022   Procedure: POLYPECTOMY;  Surgeon: Cindie Carlin POUR, DO;  [facility];  Service: Endoscopy;;   STOMACH SURGERY      Family History  Problem Relation Age of Onset   Heart disease Other    Arthritis Other    Cancer Other    Diabetes Other    Colon cancer Father 68    Allergies as of 10/24/2024 - Review Complete 10/24/2024  Allergen Reaction Noted   Codeine Other (See Comments)    Penicillins Swelling    Phenytoin Hives    Sulfonamide derivatives Other (See Comments)     Social History   Socioeconomic History   Marital status: Married    Spouse name: Not on file   Number of children: 0   Years of education: 8th grade   Highest education level: Not on file  Occupational History   Occupation: maid     Employer: [employer]  Tobacco Use   Smoking status: Never   Smokeless tobacco: Never  Vaping Use   Vaping status: Never Used  Substance and Sexual Activity   Alcohol use: No    Comment: heavy etoh x21yrs, quit 1991   Drug use: No   Sexual activity: Not Currently  Other Topics Concern   Not on file  Social History Narrative   Not on file   Social Drivers of Health   Tobacco Use: Low Risk (10/24/2024)   Patient History    Smoking Tobacco Use: Never    Smokeless Tobacco Use: Never    Passive Exposure: Not on file  Financial Resource Strain: Not on file  Food Insecurity: Not on file  Transportation Needs: Not on file  Physical Activity: Not on file  Stress: Not on file  Social Connections: Not on file  Depression (EYV7-0): Not on file  Alcohol Screen: Not on file  Housing: Not on file  Utilities: Not on file  Health Literacy: Not on file    Review of Systems   Gen: Denies fever, chills, anorexia. Denies fatigue, weakness, weight loss.  CV: Denies chest pain, palpitations, syncope, peripheral edema, and claudication. Resp: + DOE. Denies dyspnea at rest, cough, wheezing, coughing up blood, and pleurisy. GI: See HPI Derm: Denies rash, itching, dry skin Psych: Denies depression, anxiety, memory loss, confusion. No homicidal or suicidal ideation.  Heme: Denies bruising, bleeding, and enlarged lymph nodes.  Physical Exam   BP 138/81 (BP Location: Right Arm, Patient Position: Sitting, Cuff Size: Normal)   Pulse 66   Temp 97.9 F (36.6 C) (Temporal)   Ht 5' 1 (1.549 m)   Wt 160 lb (72.6 kg)   BMI 30.23 kg/m   General:   Alert and oriented. No distress noted. Pleasant and cooperative.  Head:  Normocephalic and atraumatic. Eyes:  Conjuctiva clear without scleral icterus. Mouth:  Oral mucosa pink and moist. No lesions. Lungs:  Clear to auscultation bilaterally. No wheezes, rales, or rhonchi. No distress.  Heart:  S1, S2 present without murmurs appreciated.  Abdomen:   +BS, soft, non-tender and non-distended. No rebound or guarding.  No HSM or masses noted. Rectal: deferred Msk:  Symmetrical without gross deformities. Normal posture. Neurologic:  Alert and  oriented x4 Psych:  Alert and cooperative. Normal mood and affect.  Assessment & Plan  Meah A Lunt is a 79 y.o. female presenting today for follow-up and also having some worsening constipation.    Iron deficiency anemia Chronic iron supplementation (2-3 tablets daily) without recent laboratory monitoring raises concern for possible overtreatment. Laboratory evaluation is required to guide ongoing management.  Does have some dyspnea on exertion but usually with prolonged distances not short distances, otherwise no dizziness, lightheadedness, syncope, melena, or BRBPR.  Does have dark almost black stools secondary to iron intake.  Last hemoglobin in January 2025 stable at 12.  Colonoscopy up-to-date as of October 2023 with just evidence of hemorrhoids, no cause for anemia found at that time. - CBC, iron panel. - Advised that, if laboratory values are adequate or elevated, iron supplementation may be reduced to one tablet daily. - Monitor for tarry stools/melena and BRBPR.  If having tarry stools with need to consider EGD.  Chronic constipation Longstanding constipation is exacerbated by iron therapy, resulting in infrequent and difficult bowel movements. Symptoms may improve with nightly osmotic laxative and potential reduction in iron dosing.  Denies any significant abdominal pain.  Taking Orlando' milk of magnesia about once per week to have a bowel movement, may occasionally use lactulose. - Recommended nightly use of Miralax (or Clearlax) to improve bowel regularity. - Provided instructions on dosing and preparation of Miralax. - Discussed that reduction in iron supplementation may help alleviate constipation if laboratory values permit.  Gastroesophageal reflux disease GERD is well controlled on  current therapy, with no recent symptoms or complications. - Continued omeprazole 20 mg daily.      Follow up   Follow up 4-6 months    Charmaine Melia, MSN, FNP-BC, AGACNP-BC [facility] "

## 2024-10-24 NOTE — Patient Instructions (Addendum)
 To help with constipation rather than taking phillips milk of magnesia I would like for you to take Miralax (or generic Clearlax) 1 capful daily in 8 oz of water . May be best for you to take before bed.     Continue taking your iron twice daily.  I am going to check your hemoglobin as well as your iron levels to see if we can reduce your iron intake.  Hopefully with less iron you may not have as much trouble with constipation.  Please have blood work completed at [facility].  We will call you with results once they have been received. Please allow 3-5 business days for review. Locations for [facility] in [place]:              [address], [place]    Please let me know if you start developing any lightheadedness, dizziness, chest pain, worsening shortness of breath, tarry black stools, or bright red blood in your stool.  Dark stool is normal on iron but if it looks more shiny in nature then let us  know so we can do further evaluation.   Follow up in 4-6 months.   It was a pleasure to see you today. I want to create trusting relationships with patients. If you receive a survey regarding your visit,  I greatly appreciate you taking time to fill this out on paper or through your MyChart. I value your feedback.  Charmaine Melia, MSN, FNP-BC, AGACNP-BC [facility]

## 2024-10-25 ENCOUNTER — Ambulatory Visit: Payer: Self-pay | Admitting: Gastroenterology

## 2024-10-25 LAB — CBC
Hematocrit: 39.5 % (ref 34.0–46.6)
Hemoglobin: 12.5 g/dL (ref 11.1–15.9)
MCH: 28.1 pg (ref 26.6–33.0)
MCHC: 31.6 g/dL (ref 31.5–35.7)
MCV: 89 fL (ref 79–97)
Platelets: 317 10*3/uL (ref 150–450)
RBC: 4.45 x10E6/uL (ref 3.77–5.28)
RDW: 13.2 % (ref 11.7–15.4)
WBC: 9.4 10*3/uL (ref 3.4–10.8)

## 2024-10-25 LAB — IRON,TIBC AND FERRITIN PANEL
Ferritin: 343 ng/mL — ABNORMAL HIGH (ref 15–150)
Iron Saturation: 23 % (ref 15–55)
Iron: 60 ug/dL (ref 27–139)
Total Iron Binding Capacity: 266 ug/dL (ref 250–450)
UIBC: 206 ug/dL (ref 118–369)

## 2025-02-21 ENCOUNTER — Ambulatory Visit: Admitting: Gastroenterology
